# Patient Record
Sex: Female | Born: 1980 | Race: Black or African American | Marital: Single | Smoking: Current some day smoker
Health system: Southern US, Community
[De-identification: ages and names within clinical notes are randomized; demographics above are authoritative.]

## PROBLEM LIST (undated history)

## (undated) DIAGNOSIS — K219 Gastro-esophageal reflux disease without esophagitis: Secondary | ICD-10-CM

## (undated) DIAGNOSIS — J40 Bronchitis, not specified as acute or chronic: Secondary | ICD-10-CM

## (undated) DIAGNOSIS — R011 Cardiac murmur, unspecified: Secondary | ICD-10-CM

## (undated) DIAGNOSIS — E049 Nontoxic goiter, unspecified: Secondary | ICD-10-CM

## (undated) DIAGNOSIS — J349 Unspecified disorder of nose and nasal sinuses: Secondary | ICD-10-CM

## (undated) NOTE — ED Provider Notes (Signed)
 Formatting of this note is different from the original. EMERGENCY DEPARTMENT ENCOUNTER    CHIEF COMPLAINT   Chief Complaint  Patient presents with  ? COVID Positive    Patient presents to ED c/o body aches and fever. Pt has COVID.   HPI   Ruth Hubbard is a 40 y.o. female who presents with report of body aches and fever.  Patient reportedly tested positive for COVID at home yesterday.  She states fever resolved takes medicine but then comes back.  She has not had any sore throat, but endorses significant nasal congestion.  Reports nausea without vomiting, no diarrhea or constipation.  Denies asthma or COPD history.  Denies any other significant medical conditions.  No other concerns  PAST MEDICAL HISTORY   No past medical history on file.  SURGICAL HISTORY   No past surgical history on file.  CURRENT MEDICATIONS   Current Facility-Administered Medications  Medication Dose Route Frequency Provider Last Rate Last Admin  ? ketorolac  (TORADOL ) 30 mg/mL (1 mL) injection 15 mg  15 mg Intramuscular Once Jacquelynne Leretha Slack, PA-C       Current Outpatient Medications  Medication Sig Dispense Refill  ? nirmatrelvir-ritonavir (PAXLOVID) medication dosepack Take 400 mg by mouth 2 (two) times daily for 5 days. 30 tablet 0  ? ondansetron  (ZOFRAN -ODT) 4 MG disintegrating tablet Take 1 tablet (4 mg total) by mouth every 8 (eight) hours as needed for Nausea for up to 7 days. 20 tablet 0   ALLERGIES   No Known Allergies  FAMILY HISTORY   No family history on file.  SOCIAL HISTORY   Social History   Socioeconomic History  ? Marital status: Single    Spouse name: Not on file  ? Number of children: Not on file  ? Years of education: Not on file  ? Highest education level: Not on file  Occupational History  ? Not on file  Tobacco Use  ? Smoking status: Not on file  ? Smokeless tobacco: Not on file  Substance and Sexual Activity  ? Alcohol use: Not on file  ? Drug use: Not on file  ? Sexual  activity: Not on file  Other Topics Concern  ? Not on file  Social History Narrative  ? Not on file   Social Determinants of Health   Financial Resource Strain: Not on file  Food Insecurity: Not on file  Transportation Needs: Not on file  Social Connections: Not on file  Housing Stability: Not on file   REVIEW OF SYSTEMS   All systems reviewed and negative except for those mentioned above See HPI for further details.  PHYSICAL EXAM   VITAL SIGNS: BP 119/73   Pulse 100   Temp 99.9 F (37.7 C) (Oral) Comment: tylenol  at 1100  Resp 20   LMP 03/05/2022 (Approximate)   SpO2 95%  Constitutional:  Well developed, well nourished, no acute distress, non-toxic appearance.   Eyes:  PERRLA, EOMI,  conjunctiva and sclera clear, no discharge HENT: Atraumatic, Normocephalic, oral pharynx pink and moist, no tonsillar exudates or hypertrophy, no peritonsillar abscess or cellulitis. nares clear bilaterally.  TMs pearly gray with no effusion or erythema  Neck:Normal inspection, normal range of motion, supple, no meningeal signs, no lymphadenopathy Respiratory:  Normal breath sounds, no respiratory distress, no wheezing, no chest tenderness.  Cardiovascular:  Normal heart rate, normal rhythm, no murmurs, no rubs, no gallops.  Extremities:  Intact distal pulses, no edema Back: No midline or CVA tenderness.  Skin:  Warm, dry,  no erythema, no rash.  Neurologic:  Awake and alert, gross normal cognition No focal deficits noted,  CN II-XII  Intact as tested  RADIOLOGY   Reviewed by me. See official radiology interpretation.  LAB   ED COURSE & MEDICAL DECISION MAKING   Pertinent labs & imaging studies reviewed. (See chart for details)  Nontoxic-appearing 56 year old female no significant medical conditions coming in with positive home COVID test yesterday.  Body aches and URI symptoms today.  Differential includes but not limited to viral illness including COVID, flu, RSV.   Labs:  COVID, flu,  RSV   Imaging:  Not indicated  Patient positive home COVID test yesterday.  Discussed supportive care for symptoms.  She is requesting COVID testing here.  Discussed this would not change the course of treatment as this is a virus.  She then states she wants Paxlovid.  Discussed with patient risks and benefits of this medication.  She is fairly low risk for progressive disease, oxygen saturation 95% here with heart rate of 100, but patient has a fever and has not had medication and 5 hours.  Discussed that without asthma, COPD or other severe chronic underlying conditions the risks of this medication may outweigh the benefits.  Patient states she does not care her cousin had it and recommended she get it to.  She understands that if she gets side effects from the medication she should stop taking it. She was given a shot of Toradol  here for body aches.  Zofran  as needed for nausea.  Vital signs are stable.  Appropriate for discharge customary return precautions provided  CLINICAL IMPRESSION   1. COVID-19 virus infection      Portions of this note may be dictated using Dragon Naturally Speaking voice recognition software.  Variances in spelling and vocabulary are possible and unintentional.  Not all errors are caught/corrected.  Please notify the dino if any discrepancies are noted or if the meaning of any statement is not clear.  Jacquelynne Leretha Slack, PA-C 03/14/22 1655  Cosigned by Prentice All, MD at 03/14/2022  6:20 PM EST Electronically signed by Jacquelynne Leretha Slack, PA-C at 03/14/2022  4:55 PM EST Electronically signed by Prentice All, MD at 03/14/2022  6:20 PM EST

---
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
## 2001-07-28 ENCOUNTER — Other Ambulatory Visit: Admission: RE | Admit: 2001-07-28 | Discharge: 2001-07-28 | Payer: Self-pay | Admitting: Obstetrics & Gynecology

## 2003-01-25 ENCOUNTER — Encounter: Admission: RE | Admit: 2003-01-25 | Discharge: 2003-01-25 | Payer: Self-pay | Admitting: Family Medicine

## 2010-06-30 ENCOUNTER — Inpatient Hospital Stay (INDEPENDENT_AMBULATORY_CARE_PROVIDER_SITE_OTHER)
Admission: RE | Admit: 2010-06-30 | Discharge: 2010-06-30 | Disposition: A | Discharge status: Home / Self Care | Payer: Self-pay | Source: Ambulatory Visit | Attending: Family Medicine | Admitting: Family Medicine

## 2010-06-30 ENCOUNTER — Encounter: Payer: Self-pay | Admitting: Family Medicine

## 2010-06-30 DIAGNOSIS — L299 Pruritus, unspecified: Secondary | ICD-10-CM

## 2010-06-30 DIAGNOSIS — L02818 Cutaneous abscess of other sites: Secondary | ICD-10-CM

## 2010-07-02 ENCOUNTER — Encounter: Payer: Self-pay | Admitting: Emergency Medicine

## 2010-07-02 ENCOUNTER — Inpatient Hospital Stay (INDEPENDENT_AMBULATORY_CARE_PROVIDER_SITE_OTHER)
Admission: RE | Admit: 2010-07-02 | Discharge: 2010-07-02 | Disposition: A | Discharge status: Home / Self Care | Payer: Self-pay | Source: Ambulatory Visit | Attending: Emergency Medicine | Admitting: Emergency Medicine

## 2010-07-02 DIAGNOSIS — L299 Pruritus, unspecified: Secondary | ICD-10-CM

## 2010-07-02 DIAGNOSIS — L02818 Cutaneous abscess of other sites: Secondary | ICD-10-CM

## 2011-01-18 NOTE — Letter (Signed)
Summary: Out of Work  [facility]  [address]   [place], [place] [address]   Phone: [phone]  Fax: [phone]    Jun 30, 2010   Employee:  [employer] M [employer]    To Whom It May Concern:   Freyja was evaluated in our clinic this evening.   If you need additional information, please feel free to contact our office.         Sincerely,    Donna Christen MD

## 2011-01-18 NOTE — Progress Notes (Signed)
Summary: RASH GETTING WORSE   Vital Signs:  Patient Profile:   30 Years Old Female CC:      rash to face, neck and ears-progressively worse Height:     64.5 inches Weight:      140 pounds O2 Sat:      98 % O2 treatment:    Room Air Temp:     98.8 degrees F oral Pulse rate:   83 / minute Resp:     16 per minute BP sitting:   116 / 74  (left arm) Cuff size:   regular  Vitals Entered By: Lajean Saver RN (Jul 02, 2010 1:55 PM)                  Updated Prior Medication List: CEPHALEXIN 500 MG TABS (CEPHALEXIN) One by mouth three times daily (every 8 hours) VISTARIL 50 MG CAPS (HYDROXYZINE PAMOATE) One by mouth q6 to 8hr as needed itching GRIFULVIN V 500 MG TABS (GRISEOFULVIN MICROSIZE) 1 by mouth once daily PERMETHRIN 1 % LOTN (PERMETHRIN) Wash and dry hair.  Apply lotion to hair.  Leave on for 10 minutes then rinse.  May repeat one week  Current Allergies: No known allergies History of Present Illness History from: patient Chief Complaint: rash to face, neck and ears-progressively worse History of Present Illness: She was seen earlier this week.  Please see previous note.  The doctor gave her Keflex, Rocephin, Vistaril, Griseofulvin. She feels that she is getting worse in terms of her rash with increased drainage from her ears and back of neck.  No new soaps, shampoos, detergents, clothes.  She is highly sensitive to poison ivy but hasn't been around any lately.  No F/C.  She has swelling on the back of her neck and a mild HA due to that.  She is scheduled to see the dermatologist next week but can't wait until then.  She relates using things like H2O2 and alcohol on her skin to dry things out.  REVIEW OF SYSTEMS Constitutional Symptoms      Denies fever, chills, night sweats, weight loss, weight gain, and fatigue.  Eyes       Denies change in vision, eye pain, eye discharge, glasses, contact lenses, and eye surgery. Ear/Nose/Throat/Mouth       Denies hearing loss/aids,  change in hearing, ear pain, ear discharge, dizziness, frequent runny nose, frequent nose bleeds, sinus problems, sore throat, hoarseness, and tooth pain or bleeding.  Respiratory       Denies dry cough, productive cough, wheezing, shortness of breath, asthma, bronchitis, and emphysema/COPD.  Cardiovascular       Denies murmurs, chest pain, and tires easily with exhertion.    Gastrointestinal       Denies stomach pain, nausea/vomiting, diarrhea, constipation, blood in bowel movements, and indigestion. Genitourniary       Denies painful urination, kidney stones, and loss of urinary control. Neurological       Denies paralysis, seizures, and fainting/blackouts. Musculoskeletal       Denies muscle pain, joint pain, joint stiffness, decreased range of motion, redness, swelling, muscle weakness, and gout.  Skin       Denies bruising, unusual mles/lumps or sores, and hair/skin or nail changes.  Psych       Denies mood changes, temper/anger issues, anxiety/stress, speech problems, depression, and sleep problems. Other Comments: Patient has returned from visit 2 days ago. Her rash progressively become wose, speading to her neck, ears, scalp and down the side of her face.  Her neck and ears are swollen and draining clear fluid.   Past History:  Past Medical History: Reviewed history from 06/30/2010 and no changes required. thyroid goiter  Past Surgical History: Reviewed history from 06/30/2010 and no changes required. Denies surgical history  Family History: Reviewed history from 06/30/2010 and no changes required. Family History Thyroid disease  Social History: Current Smoker 2-3 cigs per day Alcohol use-yes 1-2 per mth Drug use-no Occupation: Runner, broadcasting/film/video Physical Exam General appearance: well developed, well nourished, no acute distress MSE: oriented to time, place, and person Anterior scalp is scaly, somewhat boggy and erythematous with decreased concentration of hair.  There is  evidence of dried exudate in matted hair around ears.  No swelling.  There is post-auricular andenopathy bilaterally.  There is bilateral pinna swelling and mild clear drainage.  There is moderate posterior neck LAD and swelling.  Plan New Medications/Changes: FLUOCINONIDE 0.05 % SOLN (FLUOCINONIDE) apply two times a day to hair and affected areas  #QS x1 week x 0, 07/02/2010, Hoyt Koch MD PREDNISONE (PAK) 10 MG TABS (PREDNISONE) use as directed, 6 day pack  #1 x 0, 07/02/2010, Hoyt Koch MD  New Orders: Est. Patient Level II [phone] Planning Comments:   I'm not quite sure what this rash is, but is likely a combination of fungal infection and irritation from her scratching it.  I don't necessarily think this is bacterial but would still like her to continue the Keflex.  I think if she stops scratching there will be less irritation and it will heal. I wrote her for Pred taper pack that I'd like her to hold.  I am going to start her on Fluocinonide hair solution just to get the irritation under control until she is able to see dermatology next week.  If the swelling on her ears or neck continues to worsens or she develops symptoms such as fever, I would advise that she go to the ER.  We called multiple dermatologists, but no one is able to see her sooner.   The patient and/or caregiver has been counseled thoroughly with regard to medications prescribed including dosage, schedule, interactions, rationale for use, and possible side effects and they verbalize understanding.  Diagnoses and expected course of recovery discussed and will return if not improved as expected or if the condition worsens. Patient and/or caregiver verbalized understanding.  Prescriptions: FLUOCINONIDE 0.05 % SOLN (FLUOCINONIDE) apply two times a day to hair and affected areas  #QS x1 week x 0   Entered and Authorized by:   Hoyt Koch MD   Signed by:   Hoyt Koch MD on 07/02/2010   Method used:    Print then Give to Patient   RxID:   [id number] PREDNISONE (PAK) 10 MG TABS (PREDNISONE) use as directed, 6 day pack  #1 x 0   Entered and Authorized by:   Hoyt Koch MD   Signed by:   Hoyt Koch MD on 07/02/2010   Method used:   Print then Give to Patient   RxID:   [id number]   Orders Added: 1)  Est. Patient Level II [84132]

## 2011-01-18 NOTE — Progress Notes (Signed)
Summary: ITCHING,RASH,LYMPH NODES SWOLLEN/TJ rm 4   Vital Signs:  Patient Profile:   30 Years Old Female CC:      rash x 1wk Height:     64.5 inches Weight:      142.25 pounds O2 Sat:      99 % O2 treatment:    Room Air Temp:     98.4 degrees F oral Pulse rate:   77 / minute Resp:     16 per minute BP sitting:   122 / 71  (left arm) Cuff size:   regular  Vitals Entered By: Clemens Catholic LPN (Jun 30, 2010 5:14 PM)                  Updated Prior Medication List: No Medications Current Allergies: No known allergies History of Present Illness Chief Complaint: rash x 1wk History of Present Illness:  Subjective:  Patient reports that she noticed swollen lymph nodes behind her ears about 2 weeks that have decreased in size but persisted.  About a week ago she began developing intense itching in her scalp primarily in the frontal area and around ears.  She has noticed slight drainage from her scalp over the past several days.  The itching is worse at night.  Over the past two days she has not felt well, and believes that she may have had some chills.  REVIEW OF SYSTEMS Constitutional Symptoms      Denies fever, chills, night sweats, weight loss, weight gain, and fatigue.  Eyes       Denies change in vision, eye pain, eye discharge, glasses, contact lenses, and eye surgery. Ear/Nose/Throat/Mouth       Denies hearing loss/aids, change in hearing, ear pain, ear discharge, dizziness, frequent runny nose, frequent nose bleeds, sinus problems, sore throat, hoarseness, and tooth pain or bleeding.  Respiratory       Denies dry cough, productive cough, wheezing, shortness of breath, asthma, bronchitis, and emphysema/COPD.  Cardiovascular       Denies murmurs, chest pain, and tires easily with exhertion.    Gastrointestinal       Denies stomach pain, nausea/vomiting, diarrhea, constipation, blood in bowel movements, and indigestion. Genitourniary       Denies painful urination,  kidney stones, and loss of urinary control. Neurological       Denies paralysis, seizures, and fainting/blackouts. Musculoskeletal       Denies muscle pain, joint pain, joint stiffness, decreased range of motion, redness, swelling, muscle weakness, and gout.  Skin       Denies bruising, unusual mles/lumps or sores, and hair/skin or nail changes.  Psych       Denies mood changes, temper/anger issues, anxiety/stress, speech problems, depression, and sleep problems. Blood-Lymph       Complains of unexplained lumps. Other Comments: pt c/o rash x 1wk around her hair line, ears , and face. she also states that the lymph nodes behind her ears are swollen. no fever. she had been taking bee pollen pills, she stopped those on Sunday. she put peroxide on her the rash on her head and she has been taking Benadryl at night since Sunday.   Past History:  Past Medical History: thyroid goiter  Past Surgical History: Denies surgical history  Family History: Family History Thyroid disease  Social History: Current Smoker 2-3 cigs per day Alcohol use-yes 1-2 per mth Drug use-no Smoking Status:  current Drug Use:  no   Objective:  No acute distress  Eyes:  Pupils are  equal, round, and reactive to light and accomodation.  Extraocular movement is intact.  Conjunctivae are not inflamed.  Ears:  Mild swelling, erythema, and tenderness of the superior helix bilaterally Nose:   No sinus tenderness  Mouth/pharynx:  No lesions Neck:  Supple.  No adenopathy is present.  Goiter is present  Lungs:  Clear to auscultation.  Breath sounds are equal.  Heart:  Regular rate and rhythm  Abdomen:  Nontender without masses or hepatosplenomegaly.  Bowel sounds are present.  No CVA or flank tenderness.  Scalp:  Anterior scalp is scaly, somewhat boggy and erythematous with decreased concentration of hair.  There is evidence of dried exudate in matted hair around ears.  No swelling.  Several hairs may possibly have  nits present, although these could also be debris from scalp.  Anterior scalp fluoresces under Wood's lamp.  There is post-auricular andenopathy bilaterally. Assessment New Problems: PRURITUS (ICD-698.9) CELLULITIS, SCALP (ICD-682.8)  SUSPECT UNDERLYING TINEA CAPITIS AND PEDICULOSIS CAPITIS  Plan New Medications/Changes: PERMETHRIN 1 % LOTN (PERMETHRIN) Wash and dry hair.  Apply lotion to hair.  Leave on for 10 minutes then rinse.  May repeat one week  #1 bottle x 1, 06/30/2010, Donna Christen MD GRIFULVIN V 500 MG TABS (GRISEOFULVIN MICROSIZE) 1 by mouth once daily  #15 x 0, 06/30/2010, Donna Christen MD VISTARIL 50 MG CAPS (HYDROXYZINE PAMOATE) One by mouth q6 to 8hr as needed itching  #30 x 1, 06/30/2010, Donna Christen MD CEPHALEXIN 500 MG TABS (CEPHALEXIN) One by mouth three times daily (every 8 hours)  #30 x 0, 06/30/2010, Donna Christen MD  New Orders: T-Culture, Wound [[id number]] CBC w/Diff [[id number]] T- * Misc. Laboratory test [99999] Rocephin  250mg  [J0696] Admin of Therapeutic Inj  intramuscular or subcutaneous [96372] New Patient Level III [99203] Planning Comments:   Rocephin 500mg  IM.  Begin Keflex 500mg  q8hr.  Vistaril for itching.  Begin Grifulvin V 500mg  once daily with a fatty meal (treat for 4 to 6 weeks).  Wound culture and fungal cultures pending.  When improving in 2 to 4 days, apply permethrin lotion and rinse off (may repeat in one week) If not improving, recommend dermatology evaluation   The patient and/or caregiver has been counseled thoroughly with regard to medications prescribed including dosage, schedule, interactions, rationale for use, and possible side effects and they verbalize understanding.  Diagnoses and expected course of recovery discussed and will return if not improved as expected or if the condition worsens. Patient and/or caregiver verbalized understanding.  Prescriptions: PERMETHRIN 1 % LOTN (PERMETHRIN) Wash and dry hair.  Apply lotion  to hair.  Leave on for 10 minutes then rinse.  May repeat one week  #1 bottle x 1   Entered and Authorized by:   Donna Christen MD   Signed by:   Donna Christen MD on 06/30/2010   Method used:   Print then Give to Patient   RxID:   [id number] GRIFULVIN V 500 MG TABS (GRISEOFULVIN MICROSIZE) 1 by mouth once daily  #15 x 0   Entered and Authorized by:   Donna Christen MD   Signed by:   Donna Christen MD on 06/30/2010   Method used:   Print then Give to Patient   RxID:   [id number] VISTARIL 50 MG CAPS (HYDROXYZINE PAMOATE) One by mouth q6 to 8hr as needed itching  #30 x 1   Entered and Authorized by:   Donna Christen MD   Signed by:   Donna Christen MD on 06/30/2010   Method  used:   Print then Give to Patient   RxID:   [id number] CEPHALEXIN 500 MG TABS (CEPHALEXIN) One by mouth three times daily (every 8 hours)  #30 x 0   Entered and Authorized by:   Donna Christen MD   Signed by:   Donna Christen MD on 06/30/2010   Method used:   Print then Give to Patient   RxID:   [id number]   Medication Administration  Injection # 1:    Medication: Rocephin  250mg     Diagnosis: CELLULITIS, SCALP (ICD-682.8)    Route: IM    Site: RUOQ gluteus    Exp Date: 04/16/2011    Lot #: [id number]    Mfr: sandoz    Comments: 500mg  given    Patient tolerated injection without complications    Given by: Clemens Catholic LPN (Jun 30, 2010 6:29 PM)  Orders Added: 1)  T-Culture, Wound [[id number]] 2)  CBC w/Diff [[id number]] 3)  T- * Misc. Laboratory test [99999] 4)  Rocephin  250mg  [J0696] 5)  Admin of Therapeutic Inj  intramuscular or subcutaneous [96372] 6)  New Patient Level III [01027]

## 2011-07-30 ENCOUNTER — Emergency Department (HOSPITAL_BASED_OUTPATIENT_CLINIC_OR_DEPARTMENT_OTHER)
Admission: EM | Admit: 2011-07-30 | Discharge: 2011-07-30 | Disposition: A | Discharge status: Home / Self Care | Payer: Self-pay | Attending: Emergency Medicine | Admitting: Emergency Medicine

## 2011-07-30 ENCOUNTER — Emergency Department (HOSPITAL_BASED_OUTPATIENT_CLINIC_OR_DEPARTMENT_OTHER): Payer: Self-pay

## 2011-07-30 ENCOUNTER — Encounter (HOSPITAL_BASED_OUTPATIENT_CLINIC_OR_DEPARTMENT_OTHER): Payer: Self-pay | Admitting: *Deleted

## 2011-07-30 DIAGNOSIS — G44009 Cluster headache syndrome, unspecified, not intractable: Secondary | ICD-10-CM | POA: Insufficient documentation

## 2011-07-30 DIAGNOSIS — R51 Headache: Secondary | ICD-10-CM | POA: Insufficient documentation

## 2011-07-30 HISTORY — DX: Cardiac murmur, unspecified: R01.1

## 2011-07-30 HISTORY — DX: Gastro-esophageal reflux disease without esophagitis: K21.9

## 2011-07-30 HISTORY — DX: Bronchitis, not specified as acute or chronic: J40

## 2011-07-30 HISTORY — DX: Unspecified disorder of nose and nasal sinuses: J34.9

## 2011-07-30 MED ORDER — METOCLOPRAMIDE HCL 10 MG PO TABS: 10.0000 mg | ORAL_TABLET | Freq: Four times a day (QID) | ORAL | Status: DC | PRN

## 2011-07-30 MED ORDER — PREDNISONE 50 MG PO TABS
60.0000 mg | ORAL_TABLET | Freq: Once | ORAL | Status: AC
  Administered 2011-07-30: 60 mg via ORAL
  Filled 2011-07-30: qty 1

## 2011-07-30 MED ORDER — PREDNISONE 20 MG PO TABS: 60.0000 mg | ORAL_TABLET | Freq: Every day | ORAL | Status: DC

## 2011-07-30 MED ORDER — OXYCODONE-ACETAMINOPHEN 5-325 MG PO TABS: 1.0000 | ORAL_TABLET | ORAL | Status: AC | PRN

## 2011-07-30 NOTE — Discharge Instructions (Signed)
Call the neurologist for followup and evaluation to see if you need any further testing.  Cluster Headache Cluster headaches are recognized by their pattern of deep, intense head pain. They normally occur on one side of the head. Typically, cluster headaches:  Are severe in nature.   Occur repeatedly over weeks to months at a time and are then followed by periods of no headaches.   Can last 15 minutes to 3 hours.   Occur at the same time each day, often at night.   Occur several times a day.  CAUSES The exact cause of a cluster headache is not known. Some things can trigger a cluster headache, such as:  Smoking.   Alcohol use.   Lack of sleep.   High altitude travel, such as airline travel.   Change of seasons.   Certain foods, such as foods or drinks that contain nitrates, glutamate, aspartame, or tyramine.  SYMPTOMS   Severe pain that begins in or around the eye or temple.   One-sided head pain.   Feeling sick to your stomach (nauseous).   Sensitivity to light.   Runny nose.   Eye redness, tearing, and nasal stuffiness on the side of your head where you are experiencing pain.   Sweaty, pale skin of the face.   Droopy eyelid.  DIAGNOSIS  Cluster headaches are diagnosed based on symptoms and physical exam. Your caregiver may order a computerized X-ray scan (CT or CAT scan) or a computerized magnetic scan (MRI) of your head or other lab tests to see if your headaches are caused from other medical conditions.  TREATMENT   Medications for pain relief and to prevent recurrent attacks.   Oxygen for pain relief.   Biofeedback programs may help reduce headache pain.  Some people may need a combination of medicines for cluster headaches. It may be helpful to keep a headache diary. This may help you find a trend for what is triggering your headaches. Your caregiver can develop a treatment plan that can be helpful to you.  HOME CARE INSTRUCTIONS  During cluster  periods:  Follow a regular sleep schedule.Do not vary the amount and time that you sleep from day to day. It is important to stay on the same schedule during a cluster period to help prevent headaches.   Avoid alcohol.   Stop smoking.  SEEK IMMEDIATE MEDICAL CARE IF:   You have any changes from your previous cluster headaches either in intensity or frequency.   You are not getting relief from medicines you are taking.   You faint.   You develop weakness or numbness, especially on one side of your body or face.   You develop double vision.   You develop nausea or vomiting.   You cannot keep your balance or have difficulty talking or walking.   You develop neck pain or neck stiffness.   You have a fever.  Document Released: 02/01/2005 Document Revised: 01/21/2011 Document Reviewed: 05/02/2010 [facility] Patient Information 2012 [place], [place].  Prednisone tablets What is this medicine? PREDNISONE (PRED ni sone) is a corticosteroid. It is commonly used to treat inflammation of the skin, joints, lungs, and other organs. Common conditions treated include asthma, allergies, and arthritis. It is also used for other conditions, such as blood disorders and diseases of the adrenal glands. This medicine may be used for other purposes; ask your health care provider or pharmacist if you have questions. What should I tell my health care provider before I take this medicine? They  need to know if you have any of these conditions: -Cushing's syndrome -diabetes -glaucoma -heart disease -high blood pressure -infection (especially a virus infection such as chickenpox, cold sores, or herpes) -kidney disease -liver disease -mental illness -myasthenia gravis -osteoporosis -seizures -stomach or intestine problems -thyroid disease -an unusual or allergic reaction to lactose, prednisone, other medicines, foods, dyes, or preservatives -pregnant or trying to get pregnant -breast-feeding How  should I use this medicine? Take this medicine by mouth with a glass of water. Follow the directions on the prescription label. Take this medicine with food. If you are taking this medicine once a day, take it in the morning. Do not take more medicine than you are told to take. Do not suddenly stop taking your medicine because you may develop a severe reaction. Your doctor will tell you how much medicine to take. If your doctor wants you to stop the medicine, the dose may be slowly lowered over time to avoid any side effects. Talk to your pediatrician regarding the use of this medicine in children. Special care may be needed. Overdosage: If you think you have taken too much of this medicine contact a poison control center or emergency room at once. NOTE: This medicine is only for you. Do not share this medicine with others. What if I miss a dose? If you miss a dose, take it as soon as you can. If it is almost time for your next dose, talk to your doctor or health care professional. You may need to miss a dose or take an extra dose. Do not take double or extra doses without advice. What may interact with this medicine? Do not take this medicine with any of the following medications: -metyrapone -mifepristone This medicine may also interact with the following medications: -aminoglutethimide -amphotericin B -aspirin and aspirin-like medicines -barbiturates -certain medicines for diabetes, like glipizide or glyburide -cholestyramine -cholinesterase inhibitors -cyclosporine -digoxin -diuretics -ephedrine -female hormones, like estrogens and birth control pills -isoniazid -ketoconazole -NSAIDS, medicines for pain and inflammation, like ibuprofen or naproxen -phenytoin -rifampin -toxoids -vaccines -warfarin This list may not describe all possible interactions. Give your health care provider a list of all the medicines, herbs, non-prescription drugs, or dietary supplements you use. Also tell  them if you smoke, drink alcohol, or use illegal drugs. Some items may interact with your medicine. What should I watch for while using this medicine? Visit your doctor or health care professional for regular checks on your progress. If you are taking this medicine over a prolonged period, carry an identification card with your name and address, the type and dose of your medicine, and your doctor's name and address. This medicine may increase your risk of getting an infection. Tell your doctor or health care professional if you are around anyone with measles or chickenpox, or if you develop sores or blisters that do not heal properly. If you are going to have surgery, tell your doctor or health care professional that you have taken this medicine within the last twelve months. Ask your doctor or health care professional about your diet. You may need to lower the amount of salt you eat. This medicine may affect blood sugar levels. If you have diabetes, check with your doctor or health care professional before you change your diet or the dose of your diabetic medicine. What side effects may I notice from receiving this medicine? Side effects that you should report to your doctor or health care professional as soon as possible: -allergic reactions like skin  rash, itching or hives, swelling of the face, lips, or tongue -changes in emotions or moods -changes in vision -depressed mood -eye pain -fever or chills, cough, sore throat, pain or difficulty passing urine -increased thirst -swelling of ankles, feet Side effects that usually do not require medical attention (report to your doctor or health care professional if they continue or are bothersome): -confusion, excitement, restlessness -headache -nausea, vomiting -skin problems, acne, thin and shiny skin -trouble sleeping -weight gain This list may not describe all possible side effects. Call your doctor for medical advice about side effects. You  may report side effects to FDA at 1-800-FDA-1088. Where should I keep my medicine? Keep out of the reach of children. Store at room temperature between 15 and 30 degrees C (59 and 86 degrees F). Protect from light. Keep container tightly closed. Throw away any unused medicine after the expiration date. NOTE: This sheet is a summary. It may not cover all possible information. If you have questions about this medicine, talk to your doctor, pharmacist, or health care provider.  2012, [employer]. (09/17/2010 10:57:14 AM)  Metoclopramide tablets What is this medicine? METOCLOPRAMIDE (met oh kloe PRA mide) is used to treat the symptoms of gastroesophageal reflux disease (GERD) like heartburn. It is also used to treat people with slow emptying of the stomach and intestinal tract. This medicine may be used for other purposes; ask your health care provider or pharmacist if you have questions. What should I tell my health care provider before I take this medicine? They need to know if you have any of these conditions: -breast cancer -depression -diabetes -heart failure -high blood pressure -kidney disease -liver disease -Parkinson's disease or a movement disorder -pheochromocytoma -seizures -stomach obstruction, bleeding, or perforation -an unusual or allergic reaction to metoclopramide, procainamide, sulfites, other medicines, foods, dyes, or preservatives -pregnant or trying to get pregnant -breast-feeding How should I use this medicine? Take this medicine by mouth with a glass of water. Follow the directions on the prescription label. Take this medicine on an empty stomach, about 30 minutes before eating. Take your doses at regular intervals. Do not take your medicine more often than directed. Do not stop taking except on the advice of your doctor or health care professional. A special MedGuide will be given to you by the pharmacist with each prescription and refill. Be sure to read  this information carefully each time. Talk to your pediatrician regarding the use of this medicine in children. Special care may be needed. Overdosage: If you think you have taken too much of this medicine contact a poison control center or emergency room at once. NOTE: This medicine is only for you. Do not share this medicine with others. What if I miss a dose? If you miss a dose, take it as soon as you can. If it is almost time for your next dose, take only that dose. Do not take double or extra doses. What may interact with this medicine? -acetaminophen -cyclosporine -digoxin -medicines for blood pressure -medicines for diabetes, including insulin -medicines for hay fever and other allergies -medicines for depression, especially an Monoamine Oxidase Inhibitor (MAOI) -medicines for Parkinson's disease, like levodopa -medicines for sleep or for pain -tetracycline This list may not describe all possible interactions. Give your health care provider a list of all the medicines, herbs, non-prescription drugs, or dietary supplements you use. Also tell them if you smoke, drink alcohol, or use illegal drugs. Some items may interact with your medicine. What should I watch  for while using this medicine? It may take a few weeks for your stomach condition to start to get better. However, do not take this medicine for longer than 12 weeks. The longer you take this medicine, and the more you take it, the greater your chances are of developing serious side effects. If you are an elderly patient, a female patient, or you have diabetes, you may be at an increased risk for side effects from this medicine. Contact your doctor immediately if you start having movements you cannot control such as lip smacking, rapid movements of the tongue, involuntary or uncontrollable movements of the eyes, head, arms and legs, or muscle twitches and spasms. Patients and their families should watch out for worsening depression or  thoughts of suicide. Also watch out for any sudden or severe changes in feelings such as feeling anxious, agitated, panicky, irritable, hostile, aggressive, impulsive, severely restless, overly excited and hyperactive, or not being able to sleep. If this happens, especially at the beginning of treatment or after a change in dose, call your doctor. Do not treat yourself for high fever. Ask your doctor or health care professional for advice. You may get drowsy or dizzy. Do not drive, use machinery, or do anything that needs mental alertness until you know how this drug affects you. Do not stand or sit up quickly, especially if you are an older patient. This reduces the risk of dizzy or fainting spells. Alcohol can make you more drowsy and dizzy. Avoid alcoholic drinks. What side effects may I notice from receiving this medicine? Side effects that you should report to your doctor or health care professional as soon as possible: -allergic reactions like skin rash, itching or hives, swelling of the face, lips, or tongue -abnormal production of milk in females -breast enlargement in both males and females -change in the way you walk -difficulty moving, speaking or swallowing -drooling, lip smacking, or rapid movements of the tongue -excessive sweating -fever -involuntary or uncontrollable movements of the eyes, head, arms and legs -irregular heartbeat or palpitations -muscle twitches and spasms -unusually weak or tired Side effects that usually do not require medical attention (report to your doctor or health care professional if they continue or are bothersome): -change in sex drive or performance -depressed mood -diarrhea -difficulty sleeping -headache -menstrual changes -restless or nervous This list may not describe all possible side effects. Call your doctor for medical advice about side effects. You may report side effects to FDA at 1-800-FDA-1088. Where should I keep my medicine? Keep out  of the reach of children. Store at room temperature between 20 and 25 degrees C (68 and 77 degrees F). Protect from light. Keep container tightly closed. Throw away any unused medicine after the expiration date. NOTE: This sheet is a summary. It may not cover all possible information. If you have questions about this medicine, talk to your doctor, pharmacist, or health care provider.  2012, [employer]. (09/27/2007 4:30:05 PM)  Acetaminophen; Oxycodone tablets What is this medicine? ACETAMINOPHEN; OXYCODONE (a set a MEE noe fen; ox i KOE done) is a pain reliever. It is used to treat mild to moderate pain. This medicine may be used for other purposes; ask your health care provider or pharmacist if you have questions. What should I tell my health care provider before I take this medicine? They need to know if you have any of these conditions: -brain tumor -Crohn's disease, inflammatory bowel disease, or ulcerative colitis -drink more than 3 alcohol containing drinks  per day -drug abuse or addiction -head injury -heart or circulation problems -kidney disease or problems going to the bathroom -liver disease -lung disease, asthma, or breathing problems -an unusual or allergic reaction to acetaminophen, oxycodone, other opioid analgesics, other medicines, foods, dyes, or preservatives -pregnant or trying to get pregnant -breast-feeding How should I use this medicine? Take this medicine by mouth with a full glass of water. Follow the directions on the prescription label. Take your medicine at regular intervals. Do not take your medicine more often than directed. Talk to your pediatrician regarding the use of this medicine in children. Special care may be needed. Patients over 6 years old may have a stronger reaction and need a smaller dose. Overdosage: If you think you have taken too much of this medicine contact a poison control center or emergency room at once. NOTE: This medicine  is only for you. Do not share this medicine with others. What if I miss a dose? If you miss a dose, take it as soon as you can. If it is almost time for your next dose, take only that dose. Do not take double or extra doses. What may interact with this medicine? -alcohol or medicines that contain alcohol -antihistamines -barbiturates like amobarbital, butalbital, butabarbital, methohexital, pentobarbital, phenobarbital, thiopental, and secobarbital -benztropine -drugs for bladder problems like solifenacin, trospium, oxybutynin, tolterodine, hyoscyamine, and methscopolamine -drugs for breathing problems like ipratropium and tiotropium -drugs for certain stomach or intestine problems like propantheline, homatropine methylbromide, glycopyrrolate, atropine, belladonna, and dicyclomine -general anesthetics like etomidate, ketamine, nitrous oxide, propofol, desflurane, enflurane, halothane, isoflurane, and sevoflurane -medicines for depression, anxiety, or psychotic disturbances -medicines for pain like codeine, morphine, pentazocine, buprenorphine, butorphanol, nalbuphine, tramadol, and propoxyphene -medicines for sleep -muscle relaxants -naltrexone -phenothiazines like perphenazine, thioridazine, chlorpromazine, mesoridazine, fluphenazine, prochlorperazine, promazine, and trifluoperazine -scopolamine -trihexyphenidyl This list may not describe all possible interactions. Give your health care provider a list of all the medicines, herbs, non-prescription drugs, or dietary supplements you use. Also tell them if you smoke, drink alcohol, or use illegal drugs. Some items may interact with your medicine. What should I watch for while using this medicine? Tell your doctor or health care professional if your pain does not go away, if it gets worse, or if you have new or a different type of pain. You may develop tolerance to the medicine. Tolerance means that you will need a higher dose of the medication  for pain relief. Tolerance is normal and is expected if you take this medicine for a long time. Do not suddenly stop taking your medicine because you may develop a severe reaction. Your body becomes used to the medicine. This does NOT mean you are addicted. Addiction is a behavior related to getting and using a drug for a nonmedical reason. If you have pain, you have a medical reason to take pain medicine. Your doctor will tell you how much medicine to take. If your doctor wants you to stop the medicine, the dose will be slowly lowered over time to avoid any side effects. You may get drowsy or dizzy. Do not drive, use machinery, or do anything that needs mental alertness until you know how this medicine affects you. Do not stand or sit up quickly, especially if you are an older patient. This reduces the risk of dizzy or fainting spells. Alcohol may interfere with the effect of this medicine. Avoid alcoholic drinks. The medicine will cause constipation. Try to have a bowel movement at least every 2 to 3  days. If you do not have a bowel movement for 3 days, call your doctor or health care professional. Do not take Tylenol (acetaminophen) or medicines that have acetaminophen with this medicine. Too much acetaminophen can be very dangerous. Many nonprescription medicines contain acetaminophen. Always read the labels carefully to avoid taking more acetaminophen. What side effects may I notice from receiving this medicine? Side effects that you should report to your doctor or health care professional as soon as possible: -allergic reactions like skin rash, itching or hives, swelling of the face, lips, or tongue -breathing difficulties, wheezing -confusion -light headedness or fainting spells -severe stomach pain -yellowing of the skin or the whites of the eyes Side effects that usually do not require medical attention (report to your doctor or health care professional if they continue or are  bothersome): -dizziness -drowsiness -nausea -vomiting This list may not describe all possible side effects. Call your doctor for medical advice about side effects. You may report side effects to FDA at 1-800-FDA-1088. Where should I keep my medicine? Keep out of the reach of children. This medicine can be abused. Keep your medicine in a safe place to protect it from theft. Do not share this medicine with anyone. Selling or giving away this medicine is dangerous and against the law. Store at room temperature between 20 and 25 degrees C (68 and 77 degrees F). Keep container tightly closed. Protect from light. Flush any unused medicines down the toilet. Do not use the medicine after the expiration date. NOTE: This sheet is a summary. It may not cover all possible information. If you have questions about this medicine, talk to your doctor, pharmacist, or health care provider.  2012, [employer]. (01/01/2008 10:01:21 AM)

## 2011-07-30 NOTE — ED Provider Notes (Signed)
History     CSN: [id number]  Arrival date & time 07/30/11  1244   First MD Initiated Contact with Patient 07/30/11 1308      Chief Complaint  Patient presents with  . Headache    (Consider location/radiation/quality/duration/timing/severity/associated sxs/prior treatment) Patient is a 31 y.o. female presenting with headaches. The history is provided by the patient.  Headache   She has been having a left-sided headache for the last 2 weeks. Pain is intermittent, lasts anywhere from a few seconds to 20 minutes. Pain has been getting worse and coming on more frequently. It starts as a sharp pain which starts around her left ear and radiates to her high and jaw. There is tearing associated with this but no new rhinorrhea. Denies visual disturbance. She denies photophobia and phonophobia. There's been no nausea or vomiting. She denies fever, chills, sweats. She has not had headaches like this before. She has tried a number of over-the-counter medications with no relief. Pain is currently 0/10, but has been as severe as 10 over 10. Pain will wake her up and will keep her from going to sleep. Past Medical History  Diagnosis Date  . Murmur   . Bronchitis   . Sinus trouble   . Acid reflux     History reviewed. No pertinent past surgical history.  No family history on file.  History  Substance Use Topics  . Smoking status: Current Some Day Smoker  . Smokeless tobacco: Not on file  . Alcohol Use: Yes    OB History    Grav Para Term Preterm Abortions TAB SAB Ect Mult Living                  Review of Systems  Neurological: Positive for headaches.  All other systems reviewed and are negative.    Allergies  Review of patient's allergies indicates no known allergies.  Home Medications  No current outpatient prescriptions on file.  BP 146/71  Pulse 77  Temp 98.6 F (37 C) (Oral)  Resp 22  Ht 5\' 5"  (1.651 m)  Wt 140 lb (63.504 kg)  BMI 23.30 kg/m2  SpO2 100%  LMP  07/04/2011  Physical Exam  Nursing note and vitals reviewed. 31 year old female resting comfortably and in no acute distress. Vital signs are significant for mild hypertension with blood pressure 146/71. Oxygen saturation is 100% which is normal. Head is normocephalic and atraumatic. PERRLA, EOMI. There is mild tenderness to palpation over the left temporalis muscle and over the left paracervical muscles. There is no tenderness palpation over the frontal or maxillary sinuses and no tenderness palpation over the TMJ. TMs are clear. Fundi show no hemorrhage, exudate, or papilledema. Examination the nasal cavity shows no discharge present and turbinates appear normal. Neck is supple with with no midline tenderness but mild tenderness to palpation over the insertion of the left paracervical muscles. Back is nontender. Lungs are clear without rales, wheezes, or rhonchi. Heart has regular rate and rhythm with a 2/6 systolic ejection murmur heard along the sternal border. Abdomen is soft, flat, nontender without masses or hepatosplenomegaly. Extremities have full range of motion, no cyanosis or edema. Skin is warm and dry without rash. Neurologic: Mental status is normal, cranial nerves are intact, there are no motor or sensory deficits. Deep tendon reflexes are 2+ and symmetric.   ED Course  Procedures (including critical care time)  Ct Head Wo Contrast  07/30/2011  *RADIOLOGY REPORT*  Clinical Data: Left-sided headaches extending into the jaw for  2 weeks.  CT HEAD WITHOUT CONTRAST  Technique:  Contiguous axial images were obtained from the base of the skull through the vertex without contrast.  Comparison: MRI brain 01/25/2003.  Findings: There is no evidence of acute intracranial hemorrhage, mass lesion, brain edema or extra-axial fluid collection.  The ventricles and subarachnoid spaces are appropriately sized for age. There is no CT evidence of acute cortical infarction.  The visualized paranasal sinuses are  clear. The calvarium is intact.  Apparent asymmetry of the parotid glands is likely positional.  IMPRESSION: Stable normal appearance of the brain.  Asymmetry of the parotid glands, likely positional - correlate clinically.  Original Report Authenticated By: Gerrianne Scale, M.D.     1. Cluster headache       MDM   headache which seems most consistent with a cluster headache. CT will be obtained. She is currently pain free, so no treatment modalities can be tried in less than headache comes back while she is in the ED.  Patient was informed of the CT results and proposed treatment plan. She then asked me if the CT scan and normal blood pressure meds but she was not about to have a stroke and did not have an aneurysm. She was informed that neither test guaranteed either and that the testing for an aneurysm that had not ruptured would be an MR today which is not routinely done through the emergency department. When asked why she was concerned about an aneurysm, she states that her headache felt like an aneurysm. When asked how she knew when an aneurysm felt like, she says that a friend of hers had an aneurysm of fluid and when she talked with her friend, it felt the same as her headache. I have explained to her that CT would identify a ruptured aneurysm and that the only aneurysms that cause headaches like what she is describing would be a ruptured aneurysm. I also explained, that the pattern of her headache with it being episodic is not consistent with a headache from an aneurysm. However, she is referred to [facility] who can do additional testing if they feel it is indicated. She is sent home with prescriptions for prednisone, metoclopramide, and Percocet.    Dione Booze, MD 07/30/11 [phone]

## 2011-07-30 NOTE — ED Notes (Signed)
Patient states she has had an intermittent headache for the last 2 weeks.  Describes the pain as starting in her left ear radiating into her left jaw, face and behind her eye.  Has used multiple OTC pain meds which relieve the pain.  States the pain varies up to 12 times per day.  Denies nausea or vomiting.  States pain has worsened over the last 2 days.

## 2017-09-20 ENCOUNTER — Other Ambulatory Visit (HOSPITAL_COMMUNITY): Payer: Self-pay | Admitting: Obstetrics and Gynecology

## 2017-09-20 DIAGNOSIS — E049 Nontoxic goiter, unspecified: Secondary | ICD-10-CM

## 2017-09-20 DIAGNOSIS — R011 Cardiac murmur, unspecified: Secondary | ICD-10-CM

## 2017-09-22 ENCOUNTER — Ambulatory Visit (HOSPITAL_COMMUNITY)
Admission: RE | Admit: 2017-09-22 | Discharge: 2017-09-22 | Disposition: A | Discharge status: Home / Self Care | Payer: BC Managed Care – PPO | Source: Ambulatory Visit | Attending: Obstetrics and Gynecology | Admitting: Obstetrics and Gynecology

## 2017-09-22 DIAGNOSIS — E042 Nontoxic multinodular goiter: Secondary | ICD-10-CM | POA: Insufficient documentation

## 2017-09-22 DIAGNOSIS — E041 Nontoxic single thyroid nodule: Secondary | ICD-10-CM | POA: Diagnosis present

## 2017-09-22 DIAGNOSIS — E049 Nontoxic goiter, unspecified: Secondary | ICD-10-CM

## 2017-09-27 ENCOUNTER — Other Ambulatory Visit: Payer: Self-pay | Admitting: Obstetrics and Gynecology

## 2017-09-27 DIAGNOSIS — E041 Nontoxic single thyroid nodule: Secondary | ICD-10-CM

## 2017-09-28 ENCOUNTER — Ambulatory Visit (HOSPITAL_COMMUNITY)
Admission: RE | Admit: 2017-09-28 | Discharge: 2017-09-28 | Disposition: A | Discharge status: Home / Self Care | Payer: BC Managed Care – PPO | Source: Ambulatory Visit | Attending: Obstetrics and Gynecology | Admitting: Obstetrics and Gynecology

## 2017-09-28 DIAGNOSIS — R011 Cardiac murmur, unspecified: Secondary | ICD-10-CM | POA: Diagnosis not present

## 2017-09-28 DIAGNOSIS — I351 Nonrheumatic aortic (valve) insufficiency: Secondary | ICD-10-CM | POA: Insufficient documentation

## 2017-09-28 DIAGNOSIS — I517 Cardiomegaly: Secondary | ICD-10-CM | POA: Insufficient documentation

## 2017-09-28 NOTE — Progress Notes (Signed)
  Echocardiogram 2D Echocardiogram has been performed.  Roosvelt MaserLane, Denia Mcvicar F 09/28/2017, 11:16 AM

## 2017-09-29 ENCOUNTER — Other Ambulatory Visit (HOSPITAL_COMMUNITY)
Admission: RE | Admit: 2017-09-29 | Discharge: 2017-09-29 | Disposition: A | Discharge status: Home / Self Care | Payer: BC Managed Care – PPO | Source: Ambulatory Visit | Attending: Physician Assistant | Admitting: Physician Assistant

## 2017-09-29 ENCOUNTER — Ambulatory Visit
Admission: RE | Admit: 2017-09-29 | Discharge: 2017-09-29 | Disposition: A | Discharge status: Home / Self Care | Payer: BC Managed Care – PPO | Source: Ambulatory Visit | Attending: Obstetrics and Gynecology | Admitting: Obstetrics and Gynecology

## 2017-09-29 DIAGNOSIS — E041 Nontoxic single thyroid nodule: Secondary | ICD-10-CM | POA: Diagnosis not present

## 2017-09-29 NOTE — Procedures (Addendum)
PROCEDURE SUMMARY:  Using direct ultrasound guidance, 3 passes were made using 25 g needles into the nodule within the right inferior lobe of the thyroid.   Using direct ultrasound guidance, 4 passes were made using 25 g needles into the nodule within the right mid lobe of the thyroid.   Ultrasound was used to confirm needle placements on all occasions.   Specimens were sent to Pathology for analysis.  See procedure note under Imaging tab in Epic for full procedure details.  Toniann FailWENDY S Shaylen Nephew PA-C 09/29/2017 3:47 PM

## 2018-02-16 ENCOUNTER — Encounter: Payer: Self-pay | Admitting: Cardiovascular Disease

## 2018-02-16 ENCOUNTER — Ambulatory Visit: Payer: BC Managed Care – PPO | Admitting: Cardiovascular Disease

## 2018-02-16 ENCOUNTER — Encounter

## 2018-02-16 DIAGNOSIS — R002 Palpitations: Secondary | ICD-10-CM

## 2018-02-16 DIAGNOSIS — R011 Cardiac murmur, unspecified: Secondary | ICD-10-CM | POA: Diagnosis not present

## 2018-02-16 NOTE — Patient Instructions (Signed)
Medication Instructions:  NO CHANGES If you need a refill on your cardiac medications before your next appointment, please call your pharmacy.   Lab work: NONE If you have labs (blood work) drawn today and your tests are completely normal, you will receive your results only by: Marland Kitchen MyChart Message (if you have MyChart) OR . A paper copy in the mail If you have any lab test that is abnormal or we need to change your treatment, we will call you to review the results.  Testing/Procedures: NONE  Follow-Up: At [facility], you and your health needs are our priority.  As part of our continuing mission to provide you with exceptional heart care, we have created designated Provider Care Teams.  These Care Teams include your primary Cardiologist (physician) and Advanced Practice Providers (APPs -  Physician Assistants and Nurse Practitioners) who all work together to provide you with the care you need, when you need it. You will need a follow up appointment in 3 months.  Please call our office 2 months in advance to schedule this appointment.  You may see DR. BERRY or one of the following Advanced Practice Providers on your designated Care Team:   Corine Shelter, PA-C [place], [place] . Marjie Skiff, PA-C

## 2018-02-16 NOTE — Progress Notes (Signed)
02/16/2018 Ruth PalmaGina M Gunderman   01/09/1981  [id number]  Primary Physician Richardean ChimeraMcComb, John, MD Primary Cardiologist: Runell Gess J  MD Milagros LollFACP, FACC, [place], [place]  HPI:  Ruth Hubbard is a 38 y.o. 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.  She was referred by Dr. Richardean ChimeraJohn McComb , her OB/GYN, for evaluation of a murmur and an abnormal echo.  She has no cardiac risk factors other than family history with both parents who had stents in their older years.  She does not smoke, she drinks socially although she does 3 to 4 cups of caffeine a day.  She is never had a heart attack or stroke.  She denies chest pain or shortness of breath.  She is had a long history of murmur and has palpitations that occur on a weekly basis.  She had a 2D echocardiogram performed 09/28/2017 that revealed trivial trivial MR and TR with mild AI and moderate right atrial enlargement.   No outpatient medications have been marked as taking for the 02/16/18 encounter (Office Visit) with Runell Gess,  J, MD.     No Known Allergies  Social History   Socioeconomic History  . Marital status: Single    Spouse name: Not on file  . Number of children: Not on file  . Years of education: Not on file  . Highest education level: Not on file  Occupational History  . Not on file  Social Needs  . Financial resource strain: Not on file  . Food insecurity:    Worry: Not on file    Inability: Not on file  . Transportation needs:    Medical: Not on file    Non-medical: Not on file  Tobacco Use  . Smoking status: Current Some Day Smoker  . Smokeless tobacco: Never Used  Substance and Sexual Activity  . Alcohol use: Yes  . Drug use: Not on file  . Sexual activity: Not on file  Lifestyle  . Physical activity:    Days per week: Not on file    Minutes per session: Not on file  . Stress: Not on file  Relationships  . Social connections:    Talks on phone: Not on file   Gets together: Not on file    Attends religious service: Not on file    Active member of club or organization: Not on file    Attends meetings of clubs or organizations: Not on file    Relationship status: Not on file  . Intimate partner violence:    Fear of current or ex partner: Not on file    Emotionally abused: Not on file    Physically abused: Not on file    Forced sexual activity: Not on file  Other Topics Concern  . Not on file  Social History Narrative  . Not on file     Review of Systems: General: negative for chills, fever, night sweats or weight changes.  Cardiovascular: negative for chest pain, dyspnea on exertion, edema, orthopnea, palpitations, paroxysmal nocturnal dyspnea or shortness of breath Dermatological: negative for rash Respiratory: negative for cough or wheezing Urologic: negative for hematuria Abdominal: negative for nausea, vomiting, diarrhea, bright red blood per rectum, melena, or hematemesis Neurologic: negative for visual changes, syncope, or dizziness All other systems reviewed and are otherwise negative except as noted above.    Blood pressure 130/76, pulse 78, height 5' 4.5" (1.638 m), weight 165 lb (74.8 kg).  General appearance:  alert and no distress Neck: no adenopathy, no carotid bruit, no JVD, supple, symmetrical, trachea midline and thyroid not enlarged, symmetric, no tenderness/mass/nodules Lungs: clear to auscultation bilaterally Heart: 1/6 outflow tract murmur Extremities: extremities normal, atraumatic, no cyanosis or edema Pulses: 2+ and symmetric Skin: Skin color, texture, turgor normal. No rashes or lesions Neurologic: Alert and oriented X 3, normal strength and tone. Normal symmetric reflexes. Normal coordination and gait  EKG normal sinus rhythm at 78 without ST or T wave changes.  I personally reviewed this EKG  ASSESSMENT AND PLAN:   Palpitations Ms Maxam has complaints  of palpitations that occur on a weekly basis.  She does  drink 3 to 4 cups of coffee a day.  She has no other symptoms from these.  I suggested that she discontinue caffeine intake and we reassess in 3 months prior to getting an event monitor.  Cardiac murmur Long history of cardiac murmur with recent echo performed 09/28/2017 that revealed mild AI, MR and TR moderate right atrial enlargement.  She does have a soft outflow tract murmur on exam which I am not concerned about.      Runell Gess MD FACP,FACC,FAHA, [facility] 02/16/2018 4:04 PM

## 2018-02-16 NOTE — Assessment & Plan Note (Signed)
Long history of cardiac murmur with recent echo performed 09/28/2017 that revealed mild AI, MR and TR moderate right atrial enlargement.  She does have a soft outflow tract murmur on exam which I am not concerned about.

## 2018-02-16 NOTE — Assessment & Plan Note (Signed)
Ruth Hubbard has complaints  of palpitations that occur on a weekly basis.  She does drink 3 to 4 cups of coffee a day.  She has no other symptoms from these.  I suggested that she discontinue caffeine intake and we reassess in 3 months prior to getting an event monitor.

## 2018-05-17 ENCOUNTER — Telehealth: Payer: Self-pay | Admitting: *Deleted

## 2018-05-17 NOTE — Telephone Encounter (Signed)
   Cardiac Questionnaire:    Since your last visit or hospitalization:    1. Have you been having new or worsening chest pain? NO   2. Have you been having new or worsening shortness of breath? NO 3. Have you been having new or worsening leg swelling, wt gain, or increase in abdominal girth (pants fitting more tightly)? Legs swell at times.   4. Have you had any passing out spells? NO    *A YES to any of these questions would result in the appointment being kept. *If all the answers to these questions are NO, we should indicate that given the current situation regarding the worldwide coronarvirus pandemic, at the recommendation of the CDC, we are looking to limit gatherings in our waiting area, and thus will reschedule their appointment beyond four weeks from today.   _____________   COVID-19 Pre-Screening Questions:  . Do you currently have a fever? NO (yes = cancel and refer to pcp for e-visit) . Have you recently travelled on a cruise, internationally, or to [place], [place], [place], [place], [place], or [place], [place] [employer]) ? NO (yes = cancel, stay home, monitor symptoms, and contact pcp or initiate e-visit if symptoms develop) . Have you been in contact with someone that is currently pending confirmation of Covid19 testing or has been confirmed to have the Covid19 virus?  NO (yes = cancel, stay home, away from tested individual, monitor symptoms, and contact pcp or initiate e-visit if symptoms develop) . Are you currently experiencing fatigue or cough? NO (yes = pt should be prepared to have a mask placed at the time of their visit).

## 2018-05-17 NOTE — Telephone Encounter (Signed)
error 

## 2018-05-18 ENCOUNTER — Telehealth: Payer: Self-pay | Admitting: *Deleted

## 2018-05-18 NOTE — Telephone Encounter (Signed)
Cardiac Questionnaire:                          Since your last visit or hospitalization:                          1. Have you been having new or worsening chest pain? NO                         2. Have you been having new or worsening shortness of breath? NO 3. Have you been having new or worsening leg swelling, wt gain, or increase in abdominal girth (pants fitting more tightly)? Legs swell at times.                         4. Have you had any passing out spells? NO                          *A YES to any of these questions would result in the appointment being kept. *If all the answers to these questions are NO, we should indicate that given the current situation regarding the worldwide coronarvirus pandemic, at the recommendation of the CDC, we are looking to limit gatherings in our waiting area, and thus will reschedule their appointment beyond four weeks from today.   _____________   COVID-19 Pre-Screening Questions:   Do you currently have a fever? NO (yes = cancel and refer to pcp for e-visit)  Have you recently travelled on a cruise, internationally, or to [place], [place], [place], [place], [place], or [place], [place] [employer]) ? NO (yes = cancel, stay home, monitor symptoms, and contact pcp or initiate e-visit if symptoms develop)  Have you been in contact with someone that is currently pending confirmation of Covid19 testing or has been confirmed to have the Covid19 virus?  NO (yes = cancel, stay home, away from tested individual, monitor symptoms, and contact pcp or initiate e-visit if symptoms develop)  Are you currently experiencing fatigue or cough? NO (yes = pt should be prepared to have a mask placed at the time of their visit).

## 2018-05-19 ENCOUNTER — Ambulatory Visit: Payer: BC Managed Care – PPO | Admitting: Cardiovascular Disease

## 2018-05-26 ENCOUNTER — Telehealth: Payer: Self-pay | Admitting: *Deleted

## 2018-05-26 NOTE — Telephone Encounter (Signed)
Left message 05/25/18 and  05/26/18 for patient to call and rescheduled 05/19/18 visit with Dr. Allyson Sabal.

## 2018-06-02 ENCOUNTER — Telehealth: Payer: Self-pay | Admitting: Cardiovascular Disease

## 2018-06-02 NOTE — Telephone Encounter (Signed)
  LVM for patient to call to schedule virtual visit with Dr Allyson Sabal on 06/07/18 or 06/09/18

## 2018-06-09 ENCOUNTER — Telehealth: Payer: Self-pay | Admitting: Cardiovascular Disease

## 2018-06-09 NOTE — Telephone Encounter (Signed)
LVM to call and reschedule appt

## 2019-01-23 ENCOUNTER — Encounter (HOSPITAL_BASED_OUTPATIENT_CLINIC_OR_DEPARTMENT_OTHER): Payer: Self-pay | Admitting: *Deleted

## 2019-01-23 ENCOUNTER — Inpatient Hospital Stay (HOSPITAL_BASED_OUTPATIENT_CLINIC_OR_DEPARTMENT_OTHER)
Admission: EM | Admit: 2019-01-23 | Discharge: 2019-02-06 | DRG: 330 | Disposition: A | Discharge status: Home / Self Care | Payer: BC Managed Care – PPO | Attending: Surgery | Admitting: Surgery

## 2019-01-23 ENCOUNTER — Other Ambulatory Visit: Payer: Self-pay

## 2019-01-23 ENCOUNTER — Emergency Department (HOSPITAL_BASED_OUTPATIENT_CLINIC_OR_DEPARTMENT_OTHER): Payer: BC Managed Care – PPO

## 2019-01-23 ENCOUNTER — Encounter: Payer: Self-pay | Admitting: Emergency Medicine

## 2019-01-23 ENCOUNTER — Emergency Department
Admission: EM | Admit: 2019-01-23 | Discharge: 2019-01-23 | Disposition: A | Discharge status: Home / Self Care | Payer: BC Managed Care – PPO | Source: Home / Self Care

## 2019-01-23 DIAGNOSIS — R1030 Lower abdominal pain, unspecified: Secondary | ICD-10-CM | POA: Diagnosis not present

## 2019-01-23 DIAGNOSIS — E441 Mild protein-calorie malnutrition: Secondary | ICD-10-CM | POA: Diagnosis not present

## 2019-01-23 DIAGNOSIS — R011 Cardiac murmur, unspecified: Secondary | ICD-10-CM | POA: Diagnosis present

## 2019-01-23 DIAGNOSIS — R1031 Right lower quadrant pain: Secondary | ICD-10-CM

## 2019-01-23 DIAGNOSIS — K219 Gastro-esophageal reflux disease without esophagitis: Secondary | ICD-10-CM | POA: Diagnosis present

## 2019-01-23 DIAGNOSIS — Z833 Family history of diabetes mellitus: Secondary | ICD-10-CM | POA: Diagnosis not present

## 2019-01-23 DIAGNOSIS — Z6825 Body mass index (BMI) 25.0-25.9, adult: Secondary | ICD-10-CM | POA: Diagnosis not present

## 2019-01-23 DIAGNOSIS — Z823 Family history of stroke: Secondary | ICD-10-CM

## 2019-01-23 DIAGNOSIS — Z5331 Laparoscopic surgical procedure converted to open procedure: Secondary | ICD-10-CM | POA: Diagnosis not present

## 2019-01-23 DIAGNOSIS — G43909 Migraine, unspecified, not intractable, without status migrainosus: Secondary | ICD-10-CM | POA: Diagnosis not present

## 2019-01-23 DIAGNOSIS — Z20828 Contact with and (suspected) exposure to other viral communicable diseases: Secondary | ICD-10-CM | POA: Diagnosis present

## 2019-01-23 DIAGNOSIS — K3580 Unspecified acute appendicitis: Secondary | ICD-10-CM | POA: Diagnosis present

## 2019-01-23 DIAGNOSIS — K9189 Other postprocedural complications and disorders of digestive system: Secondary | ICD-10-CM | POA: Diagnosis not present

## 2019-01-23 DIAGNOSIS — K3533 Acute appendicitis with perforation and localized peritonitis, with abscess: Principal | ICD-10-CM | POA: Diagnosis present

## 2019-01-23 DIAGNOSIS — Z8249 Family history of ischemic heart disease and other diseases of the circulatory system: Secondary | ICD-10-CM

## 2019-01-23 DIAGNOSIS — K567 Ileus, unspecified: Secondary | ICD-10-CM | POA: Diagnosis not present

## 2019-01-23 DIAGNOSIS — F17200 Nicotine dependence, unspecified, uncomplicated: Secondary | ICD-10-CM | POA: Diagnosis present

## 2019-01-23 DIAGNOSIS — R1110 Vomiting, unspecified: Secondary | ICD-10-CM

## 2019-01-23 DIAGNOSIS — Z20822 Contact with and (suspected) exposure to covid-19: Secondary | ICD-10-CM

## 2019-01-23 DIAGNOSIS — K35211 Acute appendicitis with generalized peritonitis, with perforation and abscess: Secondary | ICD-10-CM | POA: Diagnosis present

## 2019-01-23 DIAGNOSIS — K353 Acute appendicitis with localized peritonitis, without perforation or gangrene: Secondary | ICD-10-CM

## 2019-01-23 DIAGNOSIS — K37 Unspecified appendicitis: Secondary | ICD-10-CM | POA: Diagnosis present

## 2019-01-23 DIAGNOSIS — K3521 Acute appendicitis with generalized peritonitis, with abscess: Secondary | ICD-10-CM | POA: Diagnosis present

## 2019-01-23 HISTORY — DX: Nontoxic goiter, unspecified: E04.9

## 2019-01-23 LAB — URINALYSIS, ROUTINE W REFLEX MICROSCOPIC
Bilirubin Urine: NEGATIVE
Glucose, UA: NEGATIVE mg/dL
Hgb urine dipstick: NEGATIVE
Ketones, ur: NEGATIVE mg/dL
Leukocytes,Ua: NEGATIVE
Nitrite: NEGATIVE
Protein, ur: NEGATIVE mg/dL
Specific Gravity, Urine: 1.02 (ref 1.005–1.030)
pH: 7.5 (ref 5.0–8.0)

## 2019-01-23 LAB — POCT URINALYSIS DIP (MANUAL ENTRY)
Blood, UA: NEGATIVE
Glucose, UA: NEGATIVE mg/dL
Ketones, POC UA: NEGATIVE mg/dL
Leukocytes, UA: NEGATIVE
Nitrite, UA: NEGATIVE
Protein Ur, POC: 30 mg/dL — AB
Spec Grav, UA: 1.025 (ref 1.010–1.025)
Urobilinogen, UA: 0.2 E.U./dL
pH, UA: 8.5 — AB (ref 5.0–8.0)

## 2019-01-23 LAB — COMPREHENSIVE METABOLIC PANEL
ALT: 16 U/L (ref 0–44)
AST: 15 U/L (ref 15–41)
Albumin: 3.7 g/dL (ref 3.5–5.0)
Alkaline Phosphatase: 71 U/L (ref 38–126)
Anion gap: 7 (ref 5–15)
BUN: 8 mg/dL (ref 6–20)
CO2: 25 mmol/L (ref 22–32)
Calcium: 9.1 mg/dL (ref 8.9–10.3)
Chloride: 106 mmol/L (ref 98–111)
Creatinine, Ser: 0.56 mg/dL (ref 0.44–1.00)
GFR calc Af Amer: >60 mL/min (ref >60)
GFR calc non Af Amer: >60 mL/min (ref >60)
Glucose, Bld: 87 mg/dL (ref 70–99)
Potassium: 3.8 mmol/L (ref 3.5–5.1)
Sodium: 138 mmol/L (ref 135–145)
Total Bilirubin: 0.4 mg/dL (ref 0.3–1.2)
Total Protein: 6.8 g/dL (ref 6.5–8.1)

## 2019-01-23 LAB — CBC WITH DIFFERENTIAL/PLATELET
Abs Immature Granulocytes: 0.15 10*3/uL — ABNORMAL HIGH (ref 0.00–0.07)
Basophils Absolute: 0.1 10*3/uL (ref 0.0–0.1)
Basophils Relative: 0 %
Eosinophils Absolute: 0.2 10*3/uL (ref 0.0–0.5)
Eosinophils Relative: 2 %
HCT: 41 % (ref 36.0–46.0)
Hemoglobin: 13.7 g/dL (ref 12.0–15.0)
Immature Granulocytes: 1 %
Lymphocytes Relative: 28 %
Lymphs Abs: 4 10*3/uL (ref 0.7–4.0)
MCH: 30.2 pg (ref 26.0–34.0)
MCHC: 33.4 g/dL (ref 30.0–36.0)
MCV: 90.5 fL (ref 80.0–100.0)
Monocytes Absolute: 1.1 10*3/uL — ABNORMAL HIGH (ref 0.1–1.0)
Monocytes Relative: 8 %
Neutro Abs: 8.5 10*3/uL — ABNORMAL HIGH (ref 1.7–7.7)
Neutrophils Relative %: 61 %
Platelets: 323 10*3/uL (ref 150–400)
RBC: 4.53 MIL/uL (ref 3.87–5.11)
RDW: 14.9 % (ref 11.5–15.5)
WBC: 14 10*3/uL — ABNORMAL HIGH (ref 4.0–10.5)
nRBC: 0 % (ref 0.0–0.2)

## 2019-01-23 LAB — LIPASE, BLOOD: Lipase: 40 U/L (ref 11–51)

## 2019-01-23 LAB — PREGNANCY, URINE: Preg Test, Ur: NEGATIVE

## 2019-01-23 MED ORDER — SODIUM CHLORIDE 0.9 % IV SOLN
1.0000 g | Freq: Once | INTRAVENOUS | Status: AC
  Administered 2019-01-23: 22:00:00 1 g via INTRAVENOUS
  Filled 2019-01-23: qty 10

## 2019-01-23 MED ORDER — SODIUM CHLORIDE 0.9 % IV SOLN
INTRAVENOUS | Status: AC
  Administered 2019-01-23: 22:00:00 via INTRAVENOUS

## 2019-01-23 MED ORDER — METRONIDAZOLE IN NACL 5-0.79 MG/ML-% IV SOLN
500.0000 mg | Freq: Once | INTRAVENOUS | Status: AC
  Administered 2019-01-23: 500 mg via INTRAVENOUS
  Filled 2019-01-23: qty 100

## 2019-01-23 MED ORDER — IOHEXOL 300 MG/ML  SOLN
100.0000 mL | Freq: Once | INTRAMUSCULAR | Status: AC | PRN
  Administered 2019-01-23: 19:00:00 100 mL via INTRAVENOUS

## 2019-01-23 NOTE — ED Provider Notes (Signed)
Ruth Hubbard CARE    CSN: [id number] Arrival date & time: 01/23/19  1351      History   Chief Complaint Chief Complaint  Patient presents with  . Abdominal Pain    HPI FLOIS MCTAGUE is a 38 y.o. female.   The history is provided by the patient. No language interpreter was used.  Abdominal Pain Pain location:  RLQ Pain quality: aching   Pain radiates to:  Does not radiate Pain severity:  Mild Onset quality:  Gradual Chronicity:  New Relieved by:  Nothing Ineffective treatments:  None tried Associated symptoms: no anorexia and no fever   Risk factors: no alcohol abuse and has not had multiple surgeries    Pt reports she had diffuse abdominal pain for several days.  Pt now has soreness in her right lower quadrant.  Pt also request a covid test.  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 Acid reflux   . Bronchitis   . Murmur   . Sinus trouble     Patient Active Problem List   Diagnosis Date Noted  . Palpitations 02/16/2018  . Cardiac murmur 02/16/2018    History reviewed. No pertinent surgical history.  OB History   No obstetric history on file.      Home Medications    Prior to Admission medications   Not on File    Family History Family History  Problem Relation Age of Onset  . Diabetes Mother   . Hypertension Mother   . Stroke Mother   . Hypertension Father     Social History Social History   Tobacco Use  . Smoking status: Current Some Day Smoker  . Smokeless tobacco: Never Used  Substance Use Topics  . Alcohol use: Yes  . Drug use: Not on file     Allergies   Patient has no known allergies.   Review of Systems Review of Systems  Constitutional: Negative for fever.  Gastrointestinal: Positive for abdominal pain. Negative for anorexia.  All other systems reviewed and are negative.    Physical Exam Triage Vital Signs ED Triage Vitals  Enc  Vitals Group     BP 01/23/19 1535 131/86     Pulse Rate 01/23/19 1535 61     Resp --      Temp 01/23/19 1535 97.9 F (36.6 C)     Temp Source 01/23/19 1535 Oral     SpO2 01/23/19 1535 97 %     Weight 01/23/19 1536 151 lb (68.5 kg)     Height 01/23/19 1536 5\' 4"  (1.626 m)     Head Circumference --      Peak Flow --      Pain Score 01/23/19 1536 0     Pain Loc --      Pain Edu? --      Excl. in GC? --    No data found.  Updated Vital Signs BP 131/86 (BP Location: Right Arm)   Pulse 61   Temp 97.9 F (36.6 C) (Oral)   Ht 5\' 4"  (1.626 m)   Wt 68.5 kg   LMP 01/23/2016   SpO2 97%   BMI 25.92 kg/m   Visual Acuity Right Eye Distance:   Left Eye Distance:   Bilateral Distance:    Right Eye Near:   Left Eye Near:    Bilateral Near:     Physical Exam Vitals signs  and nursing note reviewed.  Constitutional:      Appearance: She is well-developed.  HENT:     Head: Normocephalic.  Neck:     Musculoskeletal: Normal range of motion.  Cardiovascular:     Rate and Rhythm: Normal rate.  Pulmonary:     Effort: Pulmonary effort is normal.  Abdominal:     General: Abdomen is flat. Bowel sounds are normal. There is no distension.     Palpations: Abdomen is soft.     Tenderness: There is abdominal tenderness in the right lower quadrant.  Musculoskeletal: Normal range of motion.  Skin:    General: Skin is warm.     Capillary Refill: Capillary refill takes less than 2 seconds.  Neurological:     Mental Status: She is alert and oriented to person, place, and time.      UC Treatments / Results  Labs (all labs ordered are listed, but only abnormal results are displayed) Labs Reviewed  POCT URINALYSIS DIP (MANUAL ENTRY) - Abnormal; Notable for the following components:      Result Value   Color, UA brown (*)    Bilirubin, UA small (*)    pH, UA 8.5 (*)    Protein Ur, POC =30 (*)    All other components within normal limits  NOVEL CORONAVIRUS, NAA    EKG    Radiology No results found.  Procedures Procedures (including critical care time)  Medications Ordered in UC Medications - No data to display  Initial Impression / Assessment and Plan / UC Course  I have reviewed the triage vital signs and the nursing notes.  Pertinent labs & imaging results that were available during my care of the patient were reviewed by me and considered in my medical decision making (see chart for details).     Covid sendout test pending. Pt needs ct of her abdomen.  Pt distressed about possible ED wait.  Pt advised [facility] for ct.  Final Clinical Impressions(s) / UC Diagnoses   Final diagnoses:  Exposure to COVID-19 virus  Lower abdominal pain  Right lower quadrant abdominal pain     Discharge Instructions     Go to [facility] for evaluation.  Your Covid test is pending    ED Prescriptions    None     PDMP not reviewed this encounter.  An After Visit Summary was printed and given to the patient.    Fransico Meadow, [place] 01/23/19 1628

## 2019-01-23 NOTE — ED Triage Notes (Signed)
Abdominal pain, lower right x 10 days ago better 2 days ago. Denies nausea, vomiting, diarrhea, polyuria, dysuria.

## 2019-01-23 NOTE — ED Provider Notes (Signed)
[facility]ERGENCY DEPARTMENT Provider Note   CSN: [id number] Arrival date & time: 01/23/19  1717     History   Chief Complaint Chief Complaint  Patient presents with  . Abdominal Pain    HPI Ruth Hubbard is a 38 y.o. female.     Patient is a 38 year old female who presents with abdominal pain.  She has had about a 10-day history of pain in her right lower abdomen.  It started in her right lower back and now is persistent in her right lower abdomen.  However it waxes and wanes in intensity and is eased off at this point.  She denies any vaginal bleeding or discharge.  No urinary symptoms.  No associated nausea or vomiting.  No known fevers.  No change in bowels.  No prior abdominal surgeries.     Past Medical History:  Diagnosis Date  . Acid reflux   . Bronchitis   . Murmur   . Sinus trouble     Patient Active Problem List   Diagnosis Date Noted  . Appendicitis 01/23/2019  . Palpitations 02/16/2018  . Cardiac murmur 02/16/2018    History reviewed. No pertinent surgical history.   OB History   No obstetric history on file.      Home Medications    Prior to Admission medications   Not on File    Family History Family History  Problem Relation Age of Onset  . Diabetes Mother   . Hypertension Mother   . Stroke Mother   . Hypertension Father     Social History Social History   Tobacco Use  . Smoking status: Current Some Day Smoker  . Smokeless tobacco: Never Used  Substance Use Topics  . Alcohol use: Yes  . Drug use: Not on file     Allergies   Patient has no known allergies.   Review of Systems Review of Systems  Constitutional: Negative for chills, diaphoresis, fatigue and fever.  HENT: Negative for congestion, rhinorrhea and sneezing.   Eyes: Negative.   Respiratory: Negative for cough, chest tightness and shortness of breath.   Cardiovascular: Negative for chest pain and leg swelling.  Gastrointestinal: Positive for  abdominal pain. Negative for blood in stool, diarrhea, nausea and vomiting.  Genitourinary: Negative for difficulty urinating, flank pain, frequency and hematuria.  Musculoskeletal: Positive for back pain. Negative for arthralgias.  Skin: Negative for rash.  Neurological: Negative for dizziness, speech difficulty, weakness, numbness and headaches.     Physical Exam Updated Vital Signs BP 103/61 (BP Location: Left Arm)   Pulse 72   Temp 98.9 F (37.2 C) (Oral)   Resp 16   Ht 5\' 4"  (1.626 m)   Wt 68 kg   SpO2 99%   BMI 25.73 kg/m   Physical Exam Constitutional:      Appearance: She is well-developed.  HENT:     Head: Normocephalic and atraumatic.  Eyes:     Pupils: Pupils are equal, round, and reactive to light.  Neck:     Musculoskeletal: Normal range of motion and neck supple.  Cardiovascular:     Rate and Rhythm: Normal rate and regular rhythm.     Heart sounds: Normal heart sounds.  Pulmonary:     Effort: Pulmonary effort is normal. No respiratory distress.     Breath sounds: Normal breath sounds. No wheezing or rales.  Chest:     Chest wall: No tenderness.  Abdominal:     General: Bowel sounds are normal.  Palpations: Abdomen is soft.     Tenderness: There is abdominal tenderness in the right lower quadrant. There is no guarding or rebound.  Genitourinary:    Comments: Pelvic exam shows slight, physiologic discharge, no cervical motion tenderness, no adnexal tenderness Musculoskeletal: Normal range of motion.  Lymphadenopathy:     Cervical: No cervical adenopathy.  Skin:    General: Skin is warm and dry.     Findings: No rash.  Neurological:     Mental Status: She is alert and oriented to person, place, and time.      ED Treatments / Results  Labs (all labs ordered are listed, but only abnormal results are displayed) Labs Reviewed  CBC WITH DIFFERENTIAL/PLATELET - Abnormal; Notable for the following components:      Result Value   WBC 14.0 (*)     Neutro Abs 8.5 (*)    Monocytes Absolute 1.1 (*)    Abs Immature Granulocytes 0.15 (*)    All other components within normal limits  SARS CORONAVIRUS 2 (TAT 6-24 HRS)  URINALYSIS, ROUTINE W REFLEX MICROSCOPIC  PREGNANCY, URINE  COMPREHENSIVE METABOLIC PANEL  LIPASE, BLOOD    EKG None  Radiology Ct Abdomen Pelvis W Contrast  Result Date: 01/23/2019 CLINICAL DATA:  Abdominal pain with appendicitis suspected. EXAM: CT ABDOMEN AND PELVIS WITH CONTRAST TECHNIQUE: Multidetector CT imaging of the abdomen and pelvis was performed using the standard protocol following bolus administration of intravenous contrast. CONTRAST:  100mL OMNIPAQUE IOHEXOL 300 MG/ML  SOLN COMPARISON:  None. FINDINGS: Lower chest: The lung bases are clear. The heart size is normal. Hepatobiliary: The liver is normal. Normal gallbladder.There is no biliary ductal dilation. Pancreas: Normal contours without ductal dilatation. No peripancreatic fluid collection. Spleen: No splenic laceration or hematoma. Adrenals/Urinary Tract: --Adrenal glands: No adrenal hemorrhage. --Right kidney/ureter: No hydronephrosis or perinephric hematoma. --Left kidney/ureter: No hydronephrosis or perinephric hematoma. --Urinary bladder: Unremarkable. Stomach/Bowel: --Stomach/Duodenum: No hiatal hernia or other gastric abnormality. Normal duodenal course and caliber. --Small bowel: No dilatation or inflammation. --Colon: No focal abnormality. --Appendix: There are significant periappendiceal inflammatory changes. The appendix is dilated measuring up to approximately 2.3 cm. Multiple appendicoliths are noted within the distal appendix. There is some free fluid in the right lower quadrant without evidence for well-formed drainable collection. There is no free air. Vascular/Lymphatic: Atherosclerotic calcification is present within the non-aneurysmal abdominal aorta, without hemodynamically significant stenosis. --No retroperitoneal lymphadenopathy. --there are  multiple enlarged lymph nodes in the right lower quadrant, presumably reactive in etiology. --No pelvic or inguinal lymphadenopathy. Reproductive: Unremarkable Other: There is a small amount of free fluid in the patient's pelvis, presumably reactive the abdominal wall is normal. Musculoskeletal. No acute displaced fractures. IMPRESSION: 1. Findings are consistent with acute uncomplicated appendicitis. 2. Multiple enlarged right lower quadrant lymph nodes, presumably reactive in etiology. 3. Atherosclerotic changes of the abdominal aorta, greater than expected for a patient of this age. Aortic Atherosclerosis (ICD10-I70.0). Electronically Signed   By: Katherine Mantlehristopher  Green M.D.   On: 01/23/2019 19:24    Procedures Procedures (including critical care time)  Medications Ordered in ED Medications  0.9 %  sodium chloride infusion (has no administration in time range)  cefTRIAXone (ROCEPHIN) 1 g in sodium chloride 0.9 % 100 mL IVPB (has no administration in time range)  metroNIDAZOLE (FLAGYL) IVPB 500 mg (has no administration in time range)  iohexol (OMNIPAQUE) 300 MG/ML solution 100 mL (100 mLs Intravenous Contrast Given 01/23/19 1908)     Initial Impression / Assessment and Plan / ED  Course  I have reviewed the triage vital signs and the nursing notes.  Pertinent labs & imaging results that were available during my care of the patient were reviewed by me and considered in my medical decision making (see chart for details).       Patient is a 38 year old female who presents with worsening pain to her lower abdomen.  CT scan shows acute appendicitis without evidence of abscess or perforation.  She is otherwise well-appearing.  She was given Rocephin and Flagyl.  I spoke with Dr. Sheliah Hatch with general surgery who will admit the patient to [facility].  Plan is to have her appendectomy in the morning.  Final Clinical Impressions(s) / ED Diagnoses   Final diagnoses:  Acute appendicitis with  localized peritonitis, without perforation, abscess, or gangrene    ED Discharge Orders    None       Rolan Bucco, MD 01/23/19 2120

## 2019-01-23 NOTE — ED Triage Notes (Signed)
Sent here from [facility] for abd ct , c/o abd pain x 1 week

## 2019-01-23 NOTE — Discharge Instructions (Addendum)
Go to [facility] for evaluation.  Your Covid test is pending

## 2019-01-24 ENCOUNTER — Observation Stay (HOSPITAL_COMMUNITY): Payer: BC Managed Care – PPO | Admitting: Anesthesiology

## 2019-01-24 ENCOUNTER — Encounter (HOSPITAL_COMMUNITY): Admission: EM | Disposition: A | Discharge status: Home / Self Care | Payer: Self-pay | Source: Home / Self Care

## 2019-01-24 ENCOUNTER — Encounter (HOSPITAL_COMMUNITY): Payer: Self-pay | Admitting: Anesthesiology

## 2019-01-24 DIAGNOSIS — K3521 Acute appendicitis with generalized peritonitis, with abscess: Secondary | ICD-10-CM | POA: Diagnosis present

## 2019-01-24 DIAGNOSIS — K3580 Unspecified acute appendicitis: Secondary | ICD-10-CM | POA: Diagnosis present

## 2019-01-24 HISTORY — PX: LAPAROSCOPIC APPENDECTOMY: SHX408

## 2019-01-24 LAB — CBC
HCT: 38.9 % (ref 36.0–46.0)
Hemoglobin: 12.6 g/dL (ref 12.0–15.0)
MCH: 29.3 pg (ref 26.0–34.0)
MCHC: 32.4 g/dL (ref 30.0–36.0)
MCV: 90.5 fL (ref 80.0–100.0)
Platelets: 321 10*3/uL (ref 150–400)
RBC: 4.3 MIL/uL (ref 3.87–5.11)
RDW: 14.9 % (ref 11.5–15.5)
WBC: 14.3 10*3/uL — ABNORMAL HIGH (ref 4.0–10.5)
nRBC: 0 % (ref 0.0–0.2)

## 2019-01-24 LAB — COMPREHENSIVE METABOLIC PANEL
ALT: 13 U/L (ref 0–44)
AST: 12 U/L — ABNORMAL LOW (ref 15–41)
Albumin: 3.2 g/dL — ABNORMAL LOW (ref 3.5–5.0)
Alkaline Phosphatase: 68 U/L (ref 38–126)
Anion gap: 8 (ref 5–15)
BUN: 6 mg/dL (ref 6–20)
CO2: 24 mmol/L (ref 22–32)
Calcium: 8.8 mg/dL — ABNORMAL LOW (ref 8.9–10.3)
Chloride: 107 mmol/L (ref 98–111)
Creatinine, Ser: 0.5 mg/dL (ref 0.44–1.00)
GFR calc Af Amer: >60 mL/min (ref >60)
GFR calc non Af Amer: >60 mL/min (ref >60)
Glucose, Bld: 103 mg/dL — ABNORMAL HIGH (ref 70–99)
Potassium: 4.3 mmol/L (ref 3.5–5.1)
Sodium: 139 mmol/L (ref 135–145)
Total Bilirubin: 0.3 mg/dL (ref 0.3–1.2)
Total Protein: 6.3 g/dL — ABNORMAL LOW (ref 6.5–8.1)

## 2019-01-24 LAB — HIV ANTIBODY (ROUTINE TESTING W REFLEX): HIV Screen 4th Generation wRfx: NONREACTIVE

## 2019-01-24 LAB — SURGICAL PCR SCREEN
MRSA, PCR: NEGATIVE
Staphylococcus aureus: NEGATIVE

## 2019-01-24 LAB — SARS CORONAVIRUS 2 (TAT 6-24 HRS): SARS Coronavirus 2: NEGATIVE

## 2019-01-24 LAB — RESPIRATORY PANEL BY RT PCR (FLU A&B, COVID)
Influenza A by PCR: NEGATIVE
Influenza B by PCR: NEGATIVE
SARS Coronavirus 2 by RT PCR: NEGATIVE

## 2019-01-24 SURGERY — APPENDECTOMY, LAPAROSCOPIC
Anesthesia: General | Site: Abdomen

## 2019-01-24 MED ORDER — ACETAMINOPHEN 650 MG RE SUPP: 650.0000 mg | Freq: Four times a day (QID) | RECTAL | Status: DC | PRN

## 2019-01-24 MED ORDER — KETOROLAC TROMETHAMINE 15 MG/ML IJ SOLN
15.0000 mg | INTRAMUSCULAR | Status: AC
  Administered 2019-01-24: 15 mg via INTRAVENOUS
  Filled 2019-01-24 (×2): qty 1

## 2019-01-24 MED ORDER — LACTATED RINGERS IV SOLN
INTRAVENOUS | Status: DC
  Administered 2019-01-24 (×2): via INTRAVENOUS

## 2019-01-24 MED ORDER — 0.9 % SODIUM CHLORIDE (POUR BTL) OPTIME
TOPICAL | Status: DC | PRN
  Administered 2019-01-24 (×3): 1000 mL

## 2019-01-24 MED ORDER — SUCCINYLCHOLINE CHLORIDE 200 MG/10ML IV SOSY
PREFILLED_SYRINGE | INTRAVENOUS | Status: AC
  Filled 2019-01-24: qty 10

## 2019-01-24 MED ORDER — FENTANYL CITRATE (PF) 100 MCG/2ML IJ SOLN
25.0000 ug | INTRAMUSCULAR | Status: DC | PRN
  Administered 2019-01-24 (×2): 50 ug via INTRAVENOUS

## 2019-01-24 MED ORDER — OXYCODONE HCL 5 MG PO TABS
5.0000 mg | ORAL_TABLET | Freq: Four times a day (QID) | ORAL | Status: DC | PRN
  Administered 2019-01-25 – 2019-01-30 (×9): 5 mg via ORAL
  Filled 2019-01-24 (×10): qty 1

## 2019-01-24 MED ORDER — SUGAMMADEX SODIUM 200 MG/2ML IV SOLN
INTRAVENOUS | Status: DC | PRN
  Administered 2019-01-24: 200 mg via INTRAVENOUS

## 2019-01-24 MED ORDER — MORPHINE SULFATE (PF) 4 MG/ML IV SOLN
2.0000 mg | INTRAVENOUS | Status: DC | PRN
  Administered 2019-01-24 (×2): 2 mg via INTRAVENOUS
  Administered 2019-01-25: 4 mg via INTRAVENOUS
  Filled 2019-01-24 (×3): qty 1

## 2019-01-24 MED ORDER — MEPERIDINE HCL 50 MG/ML IJ SOLN: 6.2500 mg | INTRAMUSCULAR | Status: DC | PRN

## 2019-01-24 MED ORDER — OXYCODONE HCL 5 MG/5ML PO SOLN: 5.0000 mg | Freq: Once | ORAL | Status: DC | PRN

## 2019-01-24 MED ORDER — MIDAZOLAM HCL 2 MG/2ML IJ SOLN
INTRAMUSCULAR | Status: DC | PRN
  Administered 2019-01-24: 2 mg via INTRAVENOUS

## 2019-01-24 MED ORDER — KCL IN DEXTROSE-NACL 20-5-0.45 MEQ/L-%-% IV SOLN
INTRAVENOUS | Status: DC
  Administered 2019-01-24: 03:00:00 via INTRAVENOUS
  Filled 2019-01-24 (×2): qty 1000

## 2019-01-24 MED ORDER — ACETAMINOPHEN 325 MG PO TABS: 325.0000 mg | ORAL_TABLET | ORAL | Status: DC | PRN

## 2019-01-24 MED ORDER — METHOCARBAMOL 1000 MG/10ML IJ SOLN
500.0000 mg | Freq: Three times a day (TID) | INTRAVENOUS | Status: DC | PRN
  Administered 2019-01-24 – 2019-01-25 (×2): 500 mg via INTRAVENOUS
  Filled 2019-01-24 (×2): qty 500

## 2019-01-24 MED ORDER — PROPOFOL 10 MG/ML IV BOLUS
INTRAVENOUS | Status: DC | PRN
  Administered 2019-01-24: 160 mg via INTRAVENOUS

## 2019-01-24 MED ORDER — FENTANYL CITRATE (PF) 250 MCG/5ML IJ SOLN
INTRAMUSCULAR | Status: DC | PRN
  Administered 2019-01-24 (×3): 50 ug via INTRAVENOUS
  Administered 2019-01-24: 100 ug via INTRAVENOUS
  Administered 2019-01-24 (×2): 50 ug via INTRAVENOUS

## 2019-01-24 MED ORDER — GABAPENTIN 300 MG PO CAPS
300.0000 mg | ORAL_CAPSULE | ORAL | Status: AC
  Administered 2019-01-24: 300 mg via ORAL
  Filled 2019-01-24: qty 1

## 2019-01-24 MED ORDER — MIDAZOLAM HCL 2 MG/2ML IJ SOLN
INTRAMUSCULAR | Status: AC
  Filled 2019-01-24: qty 2

## 2019-01-24 MED ORDER — MORPHINE SULFATE (PF) 2 MG/ML IV SOLN: 2.0000 mg | INTRAVENOUS | Status: DC | PRN

## 2019-01-24 MED ORDER — ONDANSETRON HCL 4 MG/2ML IJ SOLN: 4.0000 mg | Freq: Once | INTRAMUSCULAR | Status: DC | PRN

## 2019-01-24 MED ORDER — ACETAMINOPHEN 160 MG/5ML PO SOLN: 325.0000 mg | ORAL | Status: DC | PRN

## 2019-01-24 MED ORDER — CHLORHEXIDINE GLUCONATE CLOTH 2 % EX PADS
6.0000 | MEDICATED_PAD | Freq: Every day | CUTANEOUS | Status: DC
  Administered 2019-01-25: 6 via TOPICAL

## 2019-01-24 MED ORDER — ONDANSETRON HCL 4 MG/2ML IJ SOLN
INTRAMUSCULAR | Status: AC
  Filled 2019-01-24: qty 2

## 2019-01-24 MED ORDER — POLYETHYLENE GLYCOL 3350 17 G PO PACK: 17.0000 g | PACK | Freq: Every day | ORAL | Status: DC | PRN

## 2019-01-24 MED ORDER — PROPOFOL 10 MG/ML IV BOLUS
INTRAVENOUS | Status: AC
  Filled 2019-01-24: qty 20

## 2019-01-24 MED ORDER — LIDOCAINE 2% (20 MG/ML) 5 ML SYRINGE
INTRAMUSCULAR | Status: DC | PRN
  Administered 2019-01-24: 60 mg via INTRAVENOUS

## 2019-01-24 MED ORDER — SODIUM CHLORIDE 0.9 % IV SOLN
2.0000 g | INTRAVENOUS | Status: DC
  Administered 2019-01-24 – 2019-02-04 (×12): 2 g via INTRAVENOUS
  Filled 2019-01-24 (×7): qty 2
  Filled 2019-01-24: qty 20
  Filled 2019-01-24 (×2): qty 2
  Filled 2019-01-24: qty 20
  Filled 2019-01-24: qty 2

## 2019-01-24 MED ORDER — KETAMINE HCL 10 MG/ML IJ SOLN
INTRAMUSCULAR | Status: DC | PRN
  Administered 2019-01-24: 30 mg via INTRAVENOUS

## 2019-01-24 MED ORDER — FENTANYL CITRATE (PF) 100 MCG/2ML IJ SOLN
INTRAMUSCULAR | Status: AC
  Administered 2019-01-24: 50 ug via INTRAVENOUS
  Filled 2019-01-24: qty 4

## 2019-01-24 MED ORDER — ONDANSETRON HCL 4 MG/2ML IJ SOLN
4.0000 mg | Freq: Four times a day (QID) | INTRAMUSCULAR | Status: DC | PRN
  Administered 2019-01-26 – 2019-02-02 (×9): 4 mg via INTRAVENOUS
  Filled 2019-01-24 (×9): qty 2

## 2019-01-24 MED ORDER — OXYCODONE HCL 5 MG PO TABS: 5.0000 mg | ORAL_TABLET | Freq: Once | ORAL | Status: DC | PRN

## 2019-01-24 MED ORDER — FENTANYL CITRATE (PF) 250 MCG/5ML IJ SOLN
INTRAMUSCULAR | Status: AC
  Filled 2019-01-24: qty 5

## 2019-01-24 MED ORDER — MUPIROCIN 2 % EX OINT
1.0000 "application " | TOPICAL_OINTMENT | Freq: Two times a day (BID) | CUTANEOUS | Status: AC
  Administered 2019-01-25 – 2019-01-27 (×3): 1 via NASAL
  Filled 2019-01-24 (×4): qty 22

## 2019-01-24 MED ORDER — ROCURONIUM BROMIDE 10 MG/ML (PF) SYRINGE
PREFILLED_SYRINGE | INTRAVENOUS | Status: AC
  Filled 2019-01-24: qty 10

## 2019-01-24 MED ORDER — LIP MEDEX EX OINT
TOPICAL_OINTMENT | CUTANEOUS | Status: DC | PRN
  Administered 2019-01-25: via TOPICAL
  Filled 2019-01-24: qty 7

## 2019-01-24 MED ORDER — DEXAMETHASONE SODIUM PHOSPHATE 10 MG/ML IJ SOLN
INTRAMUSCULAR | Status: DC | PRN
  Administered 2019-01-24: 10 mg via INTRAVENOUS

## 2019-01-24 MED ORDER — METRONIDAZOLE IN NACL 5-0.79 MG/ML-% IV SOLN
500.0000 mg | Freq: Three times a day (TID) | INTRAVENOUS | Status: DC
  Administered 2019-01-24 – 2019-02-05 (×37): 500 mg via INTRAVENOUS
  Filled 2019-01-24 (×37): qty 100

## 2019-01-24 MED ORDER — KCL IN DEXTROSE-NACL 20-5-0.45 MEQ/L-%-% IV SOLN
INTRAVENOUS | Status: DC
  Administered 2019-01-25 – 2019-01-29 (×9): via INTRAVENOUS
  Filled 2019-01-24 (×13): qty 1000

## 2019-01-24 MED ORDER — ENSURE PRE-SURGERY PO LIQD
296.0000 mL | Freq: Once | ORAL | Status: AC
  Administered 2019-01-24: 296 mL via ORAL
  Filled 2019-01-24: qty 296

## 2019-01-24 MED ORDER — METOPROLOL TARTRATE 5 MG/5ML IV SOLN: 5.0000 mg | Freq: Four times a day (QID) | INTRAVENOUS | Status: DC | PRN

## 2019-01-24 MED ORDER — LACTATED RINGERS IR SOLN
Status: DC | PRN
  Administered 2019-01-24: 1000 mL

## 2019-01-24 MED ORDER — ONDANSETRON 4 MG PO TBDP: 4.0000 mg | ORAL_TABLET | Freq: Four times a day (QID) | ORAL | Status: DC | PRN

## 2019-01-24 MED ORDER — DEXAMETHASONE SODIUM PHOSPHATE 10 MG/ML IJ SOLN
INTRAMUSCULAR | Status: AC
  Filled 2019-01-24: qty 1

## 2019-01-24 MED ORDER — ONDANSETRON HCL 4 MG/2ML IJ SOLN
INTRAMUSCULAR | Status: DC | PRN
  Administered 2019-01-24: 4 mg via INTRAVENOUS

## 2019-01-24 MED ORDER — ACETAMINOPHEN 325 MG PO TABS: 650.0000 mg | ORAL_TABLET | Freq: Four times a day (QID) | ORAL | Status: DC | PRN

## 2019-01-24 MED ORDER — BUPIVACAINE-EPINEPHRINE (PF) 0.5% -1:200000 IJ SOLN
INTRAMUSCULAR | Status: AC
  Filled 2019-01-24: qty 1.8

## 2019-01-24 MED ORDER — BUPIVACAINE HCL 0.25 % IJ SOLN
INTRAMUSCULAR | Status: AC
  Filled 2019-01-24: qty 1

## 2019-01-24 MED ORDER — LIDOCAINE 2% (20 MG/ML) 5 ML SYRINGE
INTRAMUSCULAR | Status: AC
  Filled 2019-01-24: qty 5

## 2019-01-24 MED ORDER — ROCURONIUM BROMIDE 10 MG/ML (PF) SYRINGE
PREFILLED_SYRINGE | INTRAVENOUS | Status: DC | PRN
  Administered 2019-01-24 (×3): 10 mg via INTRAVENOUS
  Administered 2019-01-24: 50 mg via INTRAVENOUS
  Administered 2019-01-24: 10 mg via INTRAVENOUS

## 2019-01-24 MED ORDER — EPHEDRINE SULFATE-NACL 50-0.9 MG/10ML-% IV SOSY
PREFILLED_SYRINGE | INTRAVENOUS | Status: DC | PRN
  Administered 2019-01-24 (×2): 5 mg via INTRAVENOUS
  Administered 2019-01-24: 10 mg via INTRAVENOUS

## 2019-01-24 SURGICAL SUPPLY — 49 items
APPLIER CLIP ROT 10 11.4 M/L (STAPLE)
BLADE SURG SZ10 CARB STEEL (BLADE) ×3 IMPLANT
CABLE HIGH FREQUENCY MONO STRZ (ELECTRODE) ×3 IMPLANT
CELLS DAT CNTRL 66122 CELL SVR (MISCELLANEOUS) ×1 IMPLANT
CHLORAPREP W/TINT 26 (MISCELLANEOUS) ×3 IMPLANT
CLIP APPLIE ROT 10 11.4 M/L (STAPLE) IMPLANT
COVER WAND RF STERILE (DRAPES) IMPLANT
CUTTER FLEX LINEAR 45M (STAPLE) IMPLANT
DECANTER SPIKE VIAL GLASS SM (MISCELLANEOUS) ×3 IMPLANT
DERMABOND ADVANCED (GAUZE/BANDAGES/DRESSINGS) ×2
DERMABOND ADVANCED .7 DNX12 (GAUZE/BANDAGES/DRESSINGS) ×1 IMPLANT
DRSG OPSITE POSTOP 4X6 (GAUZE/BANDAGES/DRESSINGS) ×3 IMPLANT
DRSG TEGADERM 2-3/8X2-3/4 SM (GAUZE/BANDAGES/DRESSINGS) ×6 IMPLANT
ELECT BLADE TIP CTD 4 INCH (ELECTRODE) ×3 IMPLANT
ELECT REM PT RETURN 15FT ADLT (MISCELLANEOUS) ×3 IMPLANT
ENDOLOOP SUT PDS II  0 18 (SUTURE)
ENDOLOOP SUT PDS II 0 18 (SUTURE) IMPLANT
GAUZE SPONGE 2X2 8PLY STRL LF (GAUZE/BANDAGES/DRESSINGS) ×1 IMPLANT
GLOVE BIO SURGEON STRL SZ7.5 (GLOVE) ×3 IMPLANT
GOWN STRL REUS W/TWL XL LVL3 (GOWN DISPOSABLE) ×6 IMPLANT
HANDLE SUCTION POOLE (INSTRUMENTS) ×1 IMPLANT
KIT BASIN OR (CUSTOM PROCEDURE TRAY) ×3 IMPLANT
KIT TURNOVER KIT A (KITS) IMPLANT
PENCIL ELECTRO RS 15 CORD (MISCELLANEOUS) ×3 IMPLANT
PENCIL SMOKE EVACUATOR (MISCELLANEOUS) ×3 IMPLANT
POUCH SPECIMEN RETRIEVAL 10MM (ENDOMECHANICALS) ×3 IMPLANT
RELOAD 45 VASCULAR/THIN (ENDOMECHANICALS) IMPLANT
RELOAD PROXIMATE 75MM BLUE (ENDOMECHANICALS) ×6 IMPLANT
RELOAD STAPLE TA45 3.5 REG BLU (ENDOMECHANICALS) IMPLANT
RTRCTR WOUND ALEXIS 18CM MED (MISCELLANEOUS) ×3
SCISSORS LAP 5X35 DISP (ENDOMECHANICALS) ×3 IMPLANT
SET IRRIG TUBING LAPAROSCOPIC (IRRIGATION / IRRIGATOR) ×3 IMPLANT
SET TUBE SMOKE EVAC HIGH FLOW (TUBING) ×3 IMPLANT
SHEARS HARMONIC ACE PLUS 36CM (ENDOMECHANICALS) ×3 IMPLANT
SPONGE GAUZE 2X2 STER 10/PKG (GAUZE/BANDAGES/DRESSINGS) ×2
SPONGE LAP 18X18 RF (DISPOSABLE) ×3 IMPLANT
STAPLER GUN LINEAR PROX 60 (STAPLE) ×3 IMPLANT
STAPLER PROXIMATE 75MM BLUE (STAPLE) ×3 IMPLANT
SUCTION POOLE HANDLE (INSTRUMENTS) ×3
SUT MNCRL AB 4-0 PS2 18 (SUTURE) ×3 IMPLANT
SUT PDS AB 1 TP1 96 (SUTURE) ×6 IMPLANT
SUT SILK 2 0 SH CR/8 (SUTURE) ×6 IMPLANT
SYR BULB IRRIGATION 50ML (SYRINGE) ×3 IMPLANT
TOWEL OR 17X26 10 PK STRL BLUE (TOWEL DISPOSABLE) ×3 IMPLANT
TRAY FOLEY MTR SLVR 14FR STAT (SET/KITS/TRAYS/PACK) ×3 IMPLANT
TRAY LAPAROSCOPIC (CUSTOM PROCEDURE TRAY) ×3 IMPLANT
TROCAR BLADELESS OPT 5 100 (ENDOMECHANICALS) ×3 IMPLANT
TROCAR XCEL BLUNT TIP 100MML (ENDOMECHANICALS) ×3 IMPLANT
YANKAUER SUCT BULB TIP 10FT TU (MISCELLANEOUS) ×3 IMPLANT

## 2019-01-24 NOTE — Anesthesia Preprocedure Evaluation (Addendum)
Anesthesia Evaluation  Patient identified by MRN, date of birth, ID band Patient awake    Reviewed: Allergy & Precautions, H&P , NPO status , Patient's Chart, lab work & pertinent test results, reviewed documented beta blocker date and time   Airway Mallampati: II  TM Distance: >3 FB Neck ROM: full    Dental no notable dental hx. (+) Teeth Intact, Dental Advisory Given   Pulmonary neg pulmonary ROS, Current Smoker,    Pulmonary exam normal breath sounds clear to auscultation       Cardiovascular Exercise Tolerance: Good + Valvular Problems/Murmurs  Rhythm:regular Rate:Normal     Neuro/Psych negative neurological ROS  negative psych ROS   GI/Hepatic Neg liver ROS, GERD  ,  Endo/Other  negative endocrine ROS  Renal/GU negative Renal ROS  negative genitourinary   Musculoskeletal   Abdominal   Peds  Hematology negative hematology ROS (+)   Anesthesia Other Findings   Reproductive/Obstetrics negative OB ROS                            Anesthesia Physical Anesthesia Plan  ASA: II and emergent  Anesthesia Plan: General   Post-op Pain Management:    Induction: Intravenous and Cricoid pressure planned  PONV Risk Score and Plan: 3 and Ondansetron, Dexamethasone, Treatment may vary due to age or medical condition and Midazolam  Airway Management Planned: Oral ETT  Additional Equipment:   Intra-op Plan:   Post-operative Plan: Extubation in OR  Informed Consent: I have reviewed the patients History and Physical, chart, labs and discussed the procedure including the risks, benefits and alternatives for the proposed anesthesia with the patient or authorized representative who has indicated his/her understanding and acceptance.     Dental Advisory Given  Plan Discussed with: CRNA, Anesthesiologist and Surgeon  Anesthesia Plan Comments: (  )        Anesthesia Quick Evaluation

## 2019-01-24 NOTE — Progress Notes (Signed)
Patient having cramping pain; Dr Barry Dienes paged. Donne Hazel, RN

## 2019-01-24 NOTE — ED Notes (Signed)
[facility] arrived to transport pt to [facility] 

## 2019-01-24 NOTE — H&P (Addendum)
[facility] Surgery Consult/Admission Note  Ruth Hubbard 1980/10/21  [id number].    Requesting Provider: Cheron Schaumann, PA-C Chief Complaint/Reason for Consult: appendicitis  HPI:  Patient is an otherwise healthy 38 year old female who presented to the emergency department with complaints of abdominal pain.  Patient states pain started roughly 10 days ago in her right lower back and lower abdomen.  Pain was mild but intermittently severe.  Pain actually resolved roughly 3 days ago but returned.  Pain is now in her RLQ, severe, constant, nonradiating, movement makes it worse, pain medicine given here helps her pain.  No associated symptoms.  No history of abdominal surgeries.  No anticoagulation.  ROS:  Review of Systems  Constitutional: Negative for chills, diaphoresis and fever.  HENT: Negative for sore throat.   Respiratory: Negative for cough and shortness of breath.   Cardiovascular: Negative for chest pain.  Gastrointestinal: Positive for abdominal pain. Negative for blood in stool, constipation, diarrhea, nausea and vomiting.  Genitourinary: Negative for dysuria.  Skin: Negative for rash.  Neurological: Negative for dizziness and loss of consciousness.  All other systems reviewed and are negative.    Family History  Problem Relation Age of Onset  . Diabetes Mother   . Hypertension Mother   . Stroke Mother   . Hypertension Father     Past Medical History:  Diagnosis Date  . Acid reflux   . Bronchitis   . Murmur   . Sinus trouble     History reviewed. No pertinent surgical history.  Social History:  reports that she has been smoking. She has never used smokeless tobacco. She reports current alcohol use. No history on file for drug.  Allergies: No Known Allergies  Medications Prior to Admission  Medication Sig Dispense Refill  . ascorbic acid (VITAMIN C) 250 MG CHEW Chew 250 mg by mouth daily.    . Biotin w/ Vitamins C & E (HAIR SKIN & NAILS GUMMIES)  1250-7.5-7.5 MCG-MG-UNT CHEW Chew 1 tablet by mouth daily.    . Brimonidine Tartrate (LUMIFY) 0.025 % SOLN Place 1 drop into both eyes daily.    . Cyanocobalamin (VITAMIN B-12 ER PO) Take 1 tablet by mouth daily.    Marland Kitchen VITAMIN E PO Take 1 capsule by mouth daily.      Blood pressure (!) 111/55, pulse 76, temperature 99.3 F (37.4 C), temperature source Oral, resp. rate 16, height 5\' 4"  (1.626 m), weight 68 kg, SpO2 98 %.  Physical Exam Constitutional:      General: She is not in acute distress.    Appearance: Normal appearance. She is not diaphoretic.  HENT:     Head: Normocephalic and atraumatic.     Nose: Nose normal.     Mouth/Throat:     Comments: Patient wearing mask Eyes:     General: No scleral icterus.       Right eye: No discharge.        Left eye: No discharge.     Conjunctiva/sclera: Conjunctivae normal.     Pupils: Pupils are equal, round, and reactive to light.  Neck:     Musculoskeletal: Normal range of motion and neck supple.  Cardiovascular:     Rate and Rhythm: Normal rate and regular rhythm.     Pulses:          Posterior tibial pulses are 2+ on the right side and 2+ on the left side.     Heart sounds: Normal heart sounds. No murmur.  Pulmonary:  Effort: Pulmonary effort is normal. No respiratory distress.     Breath sounds: Normal breath sounds. No wheezing, rhonchi or rales.  Abdominal:     General: Bowel sounds are normal. There is no distension.     Palpations: Abdomen is soft. Abdomen is not rigid.     Tenderness: There is abdominal tenderness in the right lower quadrant. There is guarding. Positive signs include McBurney's sign.  Musculoskeletal: Normal range of motion.        General: No tenderness or deformity.     Right lower leg: No edema.     Left lower leg: No edema.  Skin:    General: Skin is warm and dry.     Findings: No rash.  Neurological:     Mental Status: She is alert and oriented to person, place, and time.  Psychiatric:         Mood and Affect: Mood normal.        Behavior: Behavior normal.     Results for orders placed or performed during the hospital encounter of 01/23/19 (from the past 48 hour(s))  Urinalysis, Routine w reflex microscopic     Status: None   Collection Time: 01/23/19  5:32 PM  Result Value Ref Range   Color, Urine YELLOW YELLOW   APPearance CLEAR CLEAR   Specific Gravity, Urine 1.020 1.005 - 1.030   pH 7.5 5.0 - 8.0   Glucose, UA NEGATIVE NEGATIVE mg/dL   Hgb urine dipstick NEGATIVE NEGATIVE   Bilirubin Urine NEGATIVE NEGATIVE   Ketones, ur NEGATIVE NEGATIVE mg/dL   Protein, ur NEGATIVE NEGATIVE mg/dL   Nitrite NEGATIVE NEGATIVE   Leukocytes,Ua NEGATIVE NEGATIVE    Comment: Microscopic not done on urines with negative protein, blood, leukocytes, nitrite, or glucose < 500 mg/dL. Performed at [facility], [address] [place], [place] [address]   Pregnancy, urine     Status: None   Collection Time: 01/23/19  5:32 PM  Result Value Ref Range   Preg Test, Ur NEGATIVE NEGATIVE    Comment:        THE SENSITIVITY OF THIS METHODOLOGY IS >20 mIU/mL. Performed at [facility], [address] [place], [place] [address]   Comprehensive metabolic panel     Status: None   Collection Time: 01/23/19  6:08 PM  Result Value Ref Range   Sodium 138 135 - 145 mmol/L   Potassium 3.8 3.5 - 5.1 mmol/L   Chloride 106 98 - 111 mmol/L   CO2 25 22 - 32 mmol/L   Glucose, Bld 87 70 - 99 mg/dL   BUN 8 6 - 20 mg/dL   Creatinine, Ser 8.29 0.44 - 1.00 mg/dL   Calcium 9.1 8.9 - 56.2 mg/dL   Total Protein 6.8 6.5 - 8.1 g/dL   Albumin 3.7 3.5 - 5.0 g/dL   AST 15 15 - 41 U/L   ALT 16 0 - 44 U/L   Alkaline Phosphatase 71 38 - 126 U/L   Total Bilirubin 0.4 0.3 - 1.2 mg/dL   GFR calc non Af Amer >60 >60 mL/min   GFR calc Af Amer >60 >60 mL/min   Anion gap 7 5 - 15    Comment: Performed at [facility], [address] [place], [place] [address]  Lipase, blood     Status:  None   Collection Time: 01/23/19  6:08 PM  Result Value Ref Range   Lipase 40 11 - 51 U/L  Comment: Performed at [facility], [address] [place], [place] [address]  CBC with Differential     Status: Abnormal   Collection Time: 01/23/19  6:08 PM  Result Value Ref Range   WBC 14.0 (H) 4.0 - 10.5 K/uL   RBC 4.53 3.87 - 5.11 MIL/uL   Hemoglobin 13.7 12.0 - 15.0 g/dL   HCT 41.0 36.0 - 46.0 %   MCV 90.5 80.0 - 100.0 fL   MCH 30.2 26.0 - 34.0 pg   MCHC 33.4 30.0 - 36.0 g/dL   RDW 14.9 11.5 - 15.5 %   Platelets 323 150 - 400 K/uL   nRBC 0.0 0.0 - 0.2 %   Neutrophils Relative % 61 %   Neutro Abs 8.5 (H) 1.7 - 7.7 K/uL   Lymphocytes Relative 28 %   Lymphs Abs 4.0 0.7 - 4.0 K/uL   Monocytes Relative 8 %   Monocytes Absolute 1.1 (H) 0.1 - 1.0 K/uL   Eosinophils Relative 2 %   Eosinophils Absolute 0.2 0.0 - 0.5 K/uL   Basophils Relative 0 %   Basophils Absolute 0.1 0.0 - 0.1 K/uL   Immature Granulocytes 1 %   Abs Immature Granulocytes 0.15 (H) 0.00 - 0.07 K/uL    Comment: Performed at [facility], [address] [place], [place] [address]  Surgical PCR screen     Status: None   Collection Time: 01/24/19  2:31 AM   Specimen: Nasal Mucosa; Nasal Swab  Result Value Ref Range   MRSA, PCR NEGATIVE NEGATIVE   Staphylococcus aureus NEGATIVE NEGATIVE    Comment: (NOTE) The Xpert SA Assay (FDA approved for NASAL specimens in patients 62 years of age and older), is one component of a comprehensive surveillance program. It is not intended to diagnose infection nor to guide or monitor treatment. Performed at [facility], [place] [address], [place], [place] [address]   Comprehensive metabolic panel     Status: Abnormal   Collection Time: 01/24/19  4:46 AM  Result Value Ref Range   Sodium 139 135 - 145 mmol/L   Potassium 4.3 3.5 - 5.1 mmol/L   Chloride 107 98 - 111 mmol/L   CO2 24 22 - 32 mmol/L   Glucose, Bld 103 (H) 70 - 99 mg/dL   BUN 6 6  - 20 mg/dL   Creatinine, Ser 0.50 0.44 - 1.00 mg/dL   Calcium 8.8 (L) 8.9 - 10.3 mg/dL   Total Protein 6.3 (L) 6.5 - 8.1 g/dL   Albumin 3.2 (L) 3.5 - 5.0 g/dL   AST 12 (L) 15 - 41 U/L   ALT 13 0 - 44 U/L   Alkaline Phosphatase 68 38 - 126 U/L   Total Bilirubin 0.3 0.3 - 1.2 mg/dL   GFR calc non Af Amer >60 >60 mL/min   GFR calc Af Amer >60 >60 mL/min   Anion gap 8 5 - 15    Comment: Performed at [facility], [place] [address] [place], [place] [address]  CBC     Status: Abnormal   Collection Time: 01/24/19  4:46 AM  Result Value Ref Range   WBC 14.3 (H) 4.0 - 10.5 K/uL   RBC 4.30 3.87 - 5.11 MIL/uL   Hemoglobin 12.6 12.0 - 15.0 g/dL   HCT 38.9 36.0 - 46.0 %   MCV 90.5 80.0 - 100.0 fL   MCH 29.3 26.0 - 34.0 pg   MCHC 32.4 30.0 - 36.0 g/dL  RDW 14.9 11.5 - 15.5 %   Platelets 321 150 - 400 K/uL   nRBC 0.0 0.0 - 0.2 %    Comment: Performed at [facility], [address] [place], [place] [id number]   Ct Abdomen Pelvis W Contrast  Result Date: 01/23/2019 CLINICAL DATA:  Abdominal pain with appendicitis suspected. EXAM: CT ABDOMEN AND PELVIS WITH CONTRAST TECHNIQUE: Multidetector CT imaging of the abdomen and pelvis was performed using the standard protocol following bolus administration of intravenous contrast. CONTRAST:  100mL OMNIPAQUE IOHEXOL 300 MG/ML  SOLN COMPARISON:  None. FINDINGS: Lower chest: The lung bases are clear. The heart size is normal. Hepatobiliary: The liver is normal. Normal gallbladder.There is no biliary ductal dilation. Pancreas: Normal contours without ductal dilatation. No peripancreatic fluid collection. Spleen: No splenic laceration or hematoma. Adrenals/Urinary Tract: --Adrenal glands: No adrenal hemorrhage. --Right kidney/ureter: No hydronephrosis or perinephric hematoma. --Left kidney/ureter: No hydronephrosis or perinephric hematoma. --Urinary bladder: Unremarkable. Stomach/Bowel: --Stomach/Duodenum: No hiatal hernia or  other gastric abnormality. Normal duodenal course and caliber. --Small bowel: No dilatation or inflammation. --Colon: No focal abnormality. --Appendix: There are significant periappendiceal inflammatory changes. The appendix is dilated measuring up to approximately 2.3 cm. Multiple appendicoliths are noted within the distal appendix. There is some free fluid in the right lower quadrant without evidence for well-formed drainable collection. There is no free air. Vascular/Lymphatic: Atherosclerotic calcification is present within the non-aneurysmal abdominal aorta, without hemodynamically significant stenosis. --No retroperitoneal lymphadenopathy. --there are multiple enlarged lymph nodes in the right lower quadrant, presumably reactive in etiology. --No pelvic or inguinal lymphadenopathy. Reproductive: Unremarkable Other: There is a small amount of free fluid in the patient's pelvis, presumably reactive the abdominal wall is normal. Musculoskeletal. No acute displaced fractures. IMPRESSION: 1. Findings are consistent with acute uncomplicated appendicitis. 2. Multiple enlarged right lower quadrant lymph nodes, presumably reactive in etiology. 3. Atherosclerotic changes of the abdominal aorta, greater than expected for a patient of this age. Aortic Atherosclerosis (ICD10-I70.0). Electronically Signed   By: Katherine Mantlehristopher  Green M.D.   On: 01/23/2019 19:24      Assessment/Plan Active Problems:   Appendicitis   Acute appendicitis  Appendicitis OR today for laparoscopic appendectomy Possible discharge from PACU COVID pending  FEN: N.p.o., IV fluids VTE: SCD's, lovenox on hold 2/2 surgery ID: Rocephin and Flagyl Foley: None Follow up: [facility] 2 weeks    Jerre SimonJessica L Bronnie Vasseur, [facility] 01/24/2019, 8:34 AM Please see amion for pager for the following: M, T, W, & Friday 7:00am - 4:30pm Thursdays 7:00am -11:30am

## 2019-01-24 NOTE — Anesthesia Procedure Notes (Signed)
Procedure Name: Intubation Date/Time: 01/24/2019 12:00 PM Performed by: Sharlette Dense, CRNA Patient Re-evaluated:Patient Re-evaluated prior to induction Oxygen Delivery Method: Circle system utilized Preoxygenation: Pre-oxygenation with 100% oxygen Induction Type: IV induction and Cricoid Pressure applied Ventilation: Mask ventilation without difficulty and Oral airway inserted - appropriate to patient size Laryngoscope Size: Sabra Heck and 2 Grade View: Grade I Tube type: Oral Tube size: 7.5 mm Number of attempts: 1 Airway Equipment and Method: Stylet Placement Confirmation: ETT inserted through vocal cords under direct vision,  positive ETCO2 and breath sounds checked- equal and bilateral Secured at: 22 cm Tube secured with: Tape Dental Injury: Teeth and Oropharynx as per pre-operative assessment

## 2019-01-24 NOTE — Discharge Instructions (Signed)

## 2019-01-24 NOTE — Anesthesia Postprocedure Evaluation (Signed)
Anesthesia Post Note  Patient: HORTENSIA DUFFIN  Procedure(s) Performed: OPEN ileocecectomy (N/A Abdomen)     Patient location during evaluation: PACU Anesthesia Type: General Level of consciousness: awake and alert Pain management: pain level controlled Vital Signs Assessment: post-procedure vital signs reviewed and stable Respiratory status: spontaneous breathing, nonlabored ventilation, respiratory function stable and patient connected to nasal cannula oxygen Cardiovascular status: blood pressure returned to baseline and stable Postop Assessment: no apparent nausea or vomiting Anesthetic complications: no    Last Vitals:  Vitals:   01/24/19 1531 01/24/19 1631  BP: (!) 103/55 (!) 103/56  Pulse: 65 66  Resp:  18  Temp: 37.1 C 36.6 C  SpO2: 97% 100%    Last Pain:  Vitals:   01/24/19 1631  TempSrc: Oral  PainSc:                  Nailah Luepke

## 2019-01-24 NOTE — Op Note (Signed)
01/23/2019 - 01/24/2019  2:10 PM  PATIENT:  Ruth Hubbard  38 y.o. female  PRE-OPERATIVE DIAGNOSIS:  appendicitis  POST-OPERATIVE DIAGNOSIS:  appendicitis  PROCEDURE:  Procedure(s): ATTEMPTED LAPAROSCOPIC APPENDECTOMY, OPEN ILEOCECECTOMY  SURGEON:  Surgeon(s) and Role:    * Griselda Miner, MD - Primary  PHYSICIAN ASSISTANT:   ASSISTANTS: Ledora Bottcher, PA   ANESTHESIA:   local and general  EBL:  50 mL   BLOOD ADMINISTERED:none  DRAINS: none   LOCAL MEDICATIONS USED:  MARCAINE     SPECIMEN:  Source of Specimen:  terminal ileum and cecum with perforated appendix  DISPOSITION OF SPECIMEN:  PATHOLOGY  COUNTS:  YES  TOURNIQUET:  * No tourniquets in log *  DICTATION: .Dragon Dictation   After informed consent was obtained the patient was brought to the operating room and placed in the supine position on the operating table.  After adequate induction of general anesthesia the patient's abdomen was prepped with ChloraPrep, allowed to dry, and draped in usual sterile manner.  An appropriate timeout was performed.  The area below the umbilicus was infiltrated with quarter percent Marcaine.  A small incision was made with a 15 blade knife.  The incision was carried through the subcutaneous tissue bluntly with a hemostat until the linea alba is identified.  The linea alba was incised with the 15 blade knife.  Each side was grasped with Coker clamps and elevated anteriorly.  The preperitoneal space was probed bluntly with a hemostat until the peritoneum was opened and access was gained to the abdominal cavity.  A 0 Vicryl pursestring stitch was placed in the fascia surrounding the opening.  The Hassan cannula was placed through the opening and anchored in place with the pursestring stitch.  The abdomen was insufflated with carbon dioxide without difficulty.  Next a suprapubic 5 mm port was placed under direct vision as well as a left upper quadrant 5 mm port.  The camera was moved to the  suprapubic port.  A Glassman grasper and harmonic scalpel were used to examine the right lower quadrant.  The appendix and cecum were very densely stuck to the pelvic sidewall.  As we peeled this area back we encountered a walled off abscess cavity.  The appendix appeared to be perforated where it joined the cecum and the entire area was very woody hard with chronic inflammation.  Because of the amount of inflammation and because the perforation was at the cecal wall the patient needed a ileocecectomy.  At this point the laparoscopy was aborted after mobilizing the right colon by incising its retroperitoneal attachment along the white line of Toldt with the harmonic scalpel.  A midline incision was made around the umbilicus with a 15 blade knife.  The incision was carried through the skin and subcutaneous tissue sharply with the electrocautery.  The Hassan cannula was removed and the rest of the incision was opened under direct vision.  A wound protector was deployed.  The rest of the right colon and hepatic flexure were mobilized by blunt right angle dissection and some sharp dissection with the harmonic scalpel.  Once the right colon was very mobile I then chose a site on the cecum just above the area of inflammation for division of the cecal wall.  The mesentery at this point was opened sharply with the electrocautery.  A GIA-75 stapler was placed across the cecum at this point, clamped, and fired thereby dividing the cecum between staple lines.  A similar area was chosen  in the terminal ileum and the mesentery was opened sharply with the electrocautery.  A GIA-75 stapler was placed across the terminal ileum, clamped, and fired thereby dividing the small bowel between staple lines.  The mesentery to the cecum was then taken down sharply with the harmonic scalpel.  The main vessel was clamped with a Kelly clamp and controlled with a 2-0 silk suture ligature.  The ileocecal ectomy specimen was then removed and sent  to pathology for further evaluation.  The small bowel and right colon approximated each other very easily with no tension.  A small opening was made on the antimesenteric side of the right colon and terminal ileum.  Each limb of a GIA-75 stapler was then placed down the appropriate limb of colon or small bowel.  The GI a stapler was clamped and fired thereby creating a nice widely patent enteroenterostomy.  The common opening was then closed with a single firing of a TA 60 stapler.  The staple line was then imbricated with several 2-0 silk Lembert stitches.  A 2-0 silk crotch stitch was placed.  The mesenteric defect was closed with 2-0 silk figure-of-eight stitches.  The anastomosis was widely patent and the bowel appeared to be very healthy with no tension.  The anastomosis was allowed to drop back into the right lower quadrant.  The abdomen is then irrigated with copious amounts of saline.  At this point all gowns and gloves were changed as well as new drapes were applied.  The fascia of the anterior abdominal wall was then closed with 2 running #1 double-stranded looped PDS sutures.  The subcutaneous tissue was irrigated with copious amounts of saline and the skin was closed with staples.  Sterile dressings were applied.  The patient tolerated the procedure well.  At the end of the case all needle sponge and instrument counts were correct.  The patient was then awakened and taken to recovery in stable condition.  PLAN OF CARE: Admit to inpatient   PATIENT DISPOSITION:  PACU - hemodynamically stable.   Delay start of Pharmacological VTE agent (>24hrs) due to surgical blood loss or risk of bleeding: no

## 2019-01-24 NOTE — Transfer of Care (Signed)
Immediate Anesthesia Transfer of Care Note  Patient: Ruth Hubbard  Procedure(s) Performed: OPEN ileocecectomy (N/A Abdomen)  Patient Location: PACU  Anesthesia Type:General  Level of Consciousness: awake  Airway & Oxygen Therapy: Patient Spontanous Breathing and Patient connected to face mask oxygen  Post-op Assessment: Report given to RN and Post -op Vital signs reviewed and stable  Post vital signs: Reviewed and stable  Last Vitals:  Vitals Value Taken Time  BP 113/63 01/24/19 1426  Temp    Pulse 66 01/24/19 1427  Resp 22 01/24/19 1427  SpO2 100 % 01/24/19 1427  Vitals shown include unvalidated device data.  Last Pain:  Vitals:   01/24/19 1129  TempSrc: Oral  PainSc:          Complications: No apparent anesthesia complications

## 2019-01-25 MED ORDER — MORPHINE SULFATE (PF) 2 MG/ML IV SOLN
1.0000 mg | INTRAVENOUS | Status: DC | PRN
  Administered 2019-01-25: 4 mg via INTRAVENOUS
  Administered 2019-01-25: 2 mg via INTRAVENOUS
  Administered 2019-01-25 (×2): 4 mg via INTRAVENOUS
  Administered 2019-01-26 – 2019-01-29 (×12): 2 mg via INTRAVENOUS
  Filled 2019-01-25 (×2): qty 1
  Filled 2019-01-25: qty 2
  Filled 2019-01-25 (×4): qty 1
  Filled 2019-01-25: qty 2
  Filled 2019-01-25: qty 1
  Filled 2019-01-25: qty 2
  Filled 2019-01-25 (×7): qty 1

## 2019-01-25 MED ORDER — INFLUENZA VAC SPLIT QUAD 0.5 ML IM SUSY
0.5000 mL | PREFILLED_SYRINGE | INTRAMUSCULAR | Status: DC
  Filled 2019-01-25: qty 0.5

## 2019-01-25 MED ORDER — KETOROLAC TROMETHAMINE 30 MG/ML IJ SOLN
30.0000 mg | Freq: Three times a day (TID) | INTRAMUSCULAR | Status: AC
  Administered 2019-01-25 (×3): 30 mg via INTRAVENOUS
  Filled 2019-01-25 (×3): qty 1

## 2019-01-25 NOTE — Progress Notes (Signed)
Patient ID: Ruth Hubbard, female   DOB: Sep 30, 1980, 38 y.o.   MRN: [id number]  1 Day Post-Op  Subjective: Complains of cramping spasm-like pain of the abdominal wall  Objective: Vital signs in last 24 hours: Temp:  [97.8 F (36.6 C)-99.8 F (37.7 C)] 99.8 F (37.7 C) (12/10 0433) Pulse Rate:  [62-76] 76 (12/10 0433) Resp:  [14-20] 20 (12/10 0433) BP: (97-114)/(55-66) 103/57 (12/10 0433) SpO2:  [94 %-100 %] 95 % (12/10 0433) Last BM Date: 01/24/19  Intake/Output from previous day: 12/09 0701 - 12/10 0700 In: 3038.3 [I.V.:2438.3; IV Piggyback:600] Out: 3557 [DUKGU:5427; Blood:50] Intake/Output this shift: No intake/output data recorded.  General appearance: alert and cooperative Resp: clear to auscultation bilaterally Cardio: regular rate and rhythm GI: Soft, appropriately tender, incisions look good.  Quiet.  Lab Results:  Recent Labs    01/23/19 1808 01/24/19 0446  WBC 14.0* 14.3*  HGB 13.7 12.6  HCT 41.0 38.9  PLT 323 321   BMET Recent Labs    01/23/19 1808 01/24/19 0446  NA 138 139  K 3.8 4.3  CL 106 107  CO2 25 24  GLUCOSE 87 103*  BUN 8 6  CREATININE 0.56 0.50  CALCIUM 9.1 8.8*   PT/INR No results for input(s): LABPROT, INR in the last 72 hours. ABG No results for input(s): PHART, HCO3 in the last 72 hours.  Invalid input(s): PCO2, PO2  Studies/Results: CT Abdomen Pelvis W Contrast  Result Date: 01/23/2019 CLINICAL DATA:  Abdominal pain with appendicitis suspected. EXAM: CT ABDOMEN AND PELVIS WITH CONTRAST TECHNIQUE: Multidetector CT imaging of the abdomen and pelvis was performed using the standard protocol following bolus administration of intravenous contrast. CONTRAST:  152mL OMNIPAQUE IOHEXOL 300 MG/ML  SOLN COMPARISON:  None. FINDINGS: Lower chest: The lung bases are clear. The heart size is normal. Hepatobiliary: The liver is normal. Normal gallbladder.There is no biliary ductal dilation. Pancreas: Normal contours without ductal dilatation. No  peripancreatic fluid collection. Spleen: No splenic laceration or hematoma. Adrenals/Urinary Tract: --Adrenal glands: No adrenal hemorrhage. --Right kidney/ureter: No hydronephrosis or perinephric hematoma. --Left kidney/ureter: No hydronephrosis or perinephric hematoma. --Urinary bladder: Unremarkable. Stomach/Bowel: --Stomach/Duodenum: No hiatal hernia or other gastric abnormality. Normal duodenal course and caliber. --Small bowel: No dilatation or inflammation. --Colon: No focal abnormality. --Appendix: There are significant periappendiceal inflammatory changes. The appendix is dilated measuring up to approximately 2.3 cm. Multiple appendicoliths are noted within the distal appendix. There is some free fluid in the right lower quadrant without evidence for well-formed drainable collection. There is no free air. Vascular/Lymphatic: Atherosclerotic calcification is present within the non-aneurysmal abdominal aorta, without hemodynamically significant stenosis. --No retroperitoneal lymphadenopathy. --there are multiple enlarged lymph nodes in the right lower quadrant, presumably reactive in etiology. --No pelvic or inguinal lymphadenopathy. Reproductive: Unremarkable Other: There is a small amount of free fluid in the patient's pelvis, presumably reactive the abdominal wall is normal. Musculoskeletal. No acute displaced fractures. IMPRESSION: 1. Findings are consistent with acute uncomplicated appendicitis. 2. Multiple enlarged right lower quadrant lymph nodes, presumably reactive in etiology. 3. Atherosclerotic changes of the abdominal aorta, greater than expected for a patient of this age. Aortic Atherosclerosis (ICD10-I70.0). Electronically Signed   By: Constance Holster M.D.   On: 01/23/2019 19:24    Anti-infectives: Anti-infectives (From admission, onward)   Start     Dose/Rate Route Frequency Ordered Stop   01/24/19 2200  cefTRIAXone (ROCEPHIN) 2 g in sodium chloride 0.9 % 100 mL IVPB     2 g 200  mL/hr over  30 Minutes Intravenous Every 24 hours 01/24/19 0213     01/24/19 0600  metroNIDAZOLE (FLAGYL) IVPB 500 mg     500 mg 100 mL/hr over 60 Minutes Intravenous Every 8 hours 01/24/19 0213     01/23/19 2130  cefTRIAXone (ROCEPHIN) 1 g in sodium chloride 0.9 % 100 mL IVPB     1 g 200 mL/hr over 30 Minutes Intravenous  Once 01/23/19 2118 01/23/19 2216   01/23/19 2130  metroNIDAZOLE (FLAGYL) IVPB 500 mg     500 mg 100 mL/hr over 60 Minutes Intravenous  Once 01/23/19 2118 01/23/19 2333      Assessment/Plan: s/p Procedure(s): OPEN ileocecectomy Continue bowel rest and ice chips  Will add Toradol to pain medicine Ambulate Postop day 1  LOS: 1 day    Chevis Pretty III 01/25/2019

## 2019-01-25 NOTE — Progress Notes (Signed)
MD paged regarding patients request for abd binder. Order received.

## 2019-01-26 LAB — NOVEL CORONAVIRUS, NAA: SARS-CoV-2, NAA: NOT DETECTED

## 2019-01-26 LAB — BASIC METABOLIC PANEL
Anion gap: 10 (ref 5–15)
BUN: 5 mg/dL — ABNORMAL LOW (ref 6–20)
CO2: 23 mmol/L (ref 22–32)
Calcium: 8.6 mg/dL — ABNORMAL LOW (ref 8.9–10.3)
Chloride: 106 mmol/L (ref 98–111)
Creatinine, Ser: 0.57 mg/dL (ref 0.44–1.00)
GFR calc Af Amer: >60 mL/min (ref >60)
GFR calc non Af Amer: >60 mL/min (ref >60)
Glucose, Bld: 108 mg/dL — ABNORMAL HIGH (ref 70–99)
Potassium: 4.2 mmol/L (ref 3.5–5.1)
Sodium: 139 mmol/L (ref 135–145)

## 2019-01-26 LAB — CBC
HCT: 36.6 % (ref 36.0–46.0)
Hemoglobin: 11.8 g/dL — ABNORMAL LOW (ref 12.0–15.0)
MCH: 29.6 pg (ref 26.0–34.0)
MCHC: 32.2 g/dL (ref 30.0–36.0)
MCV: 91.7 fL (ref 80.0–100.0)
Platelets: 294 10*3/uL (ref 150–400)
RBC: 3.99 MIL/uL (ref 3.87–5.11)
RDW: 15.2 % (ref 11.5–15.5)
WBC: 19.2 10*3/uL — ABNORMAL HIGH (ref 4.0–10.5)
nRBC: 0 % (ref 0.0–0.2)

## 2019-01-26 LAB — SURGICAL PATHOLOGY

## 2019-01-26 MED ORDER — KETOROLAC TROMETHAMINE 30 MG/ML IJ SOLN
30.0000 mg | Freq: Three times a day (TID) | INTRAMUSCULAR | Status: AC | PRN
  Administered 2019-01-26 – 2019-01-27 (×3): 30 mg via INTRAVENOUS
  Filled 2019-01-26 (×3): qty 1

## 2019-01-26 MED ORDER — METHOCARBAMOL 1000 MG/10ML IJ SOLN
500.0000 mg | Freq: Four times a day (QID) | INTRAVENOUS | Status: DC | PRN
  Administered 2019-01-26 – 2019-01-30 (×5): 500 mg via INTRAVENOUS
  Filled 2019-01-26 (×5): qty 500

## 2019-01-26 MED ORDER — ACETAMINOPHEN 500 MG PO TABS
1000.0000 mg | ORAL_TABLET | Freq: Four times a day (QID) | ORAL | Status: DC
  Administered 2019-01-26 – 2019-02-01 (×17): 1000 mg via ORAL
  Filled 2019-01-26 (×20): qty 2

## 2019-01-26 MED ORDER — LORAZEPAM 2 MG/ML IJ SOLN
0.5000 mg | Freq: Four times a day (QID) | INTRAMUSCULAR | Status: DC | PRN
  Administered 2019-01-27 – 2019-02-06 (×17): 0.5 mg via INTRAVENOUS
  Filled 2019-01-26 (×17): qty 1

## 2019-01-26 MED ORDER — PROMETHAZINE HCL 25 MG/ML IJ SOLN
12.5000 mg | Freq: Four times a day (QID) | INTRAMUSCULAR | Status: DC | PRN
  Administered 2019-01-27 – 2019-01-28 (×2): 25 mg via INTRAVENOUS
  Filled 2019-01-26 (×2): qty 1

## 2019-01-26 MED ORDER — SIMETHICONE 80 MG PO CHEW
80.0000 mg | CHEWABLE_TABLET | Freq: Four times a day (QID) | ORAL | Status: DC | PRN
  Administered 2019-01-26 – 2019-02-05 (×6): 80 mg via ORAL
  Filled 2019-01-26 (×6): qty 1

## 2019-01-26 NOTE — Progress Notes (Signed)
  2 Days Post-Op  Subjective: Pain seems a little better. Still no flatus  Objective: Vital signs in last 24 hours: Temp:  [98.2 F (36.8 C)-99.1 F (37.3 C)] 99.1 F (37.3 C) (12/11 0559) Pulse Rate:  [57-73] 73 (12/11 0559) Resp:  [17-18] 18 (12/11 0559) BP: (107-115)/(63-69) 112/63 (12/11 0559) SpO2:  [94 %-98 %] 95 % (12/11 0559) Last BM Date: 01/23/19  Intake/Output from previous day: 12/10 0701 - 12/11 0700 In: 888.7 [P.O.:480; I.V.:57; IV Piggyback:351.7] Out: 2900 [Urine:2900] Intake/Output this shift: Total I/O In: -  Out: 200 [Urine:200]  General appearance: alert and cooperative Resp: clear to auscultation bilaterally Cardio: regular rate and rhythm GI: soft, moderate tenderness. incision looks good  Lab Results:  Recent Labs    01/23/19 1808 01/24/19 0446  WBC 14.0* 14.3*  HGB 13.7 12.6  HCT 41.0 38.9  PLT 323 321   BMET Recent Labs    01/23/19 1808 01/24/19 0446  NA 138 139  K 3.8 4.3  CL 106 107  CO2 25 24  GLUCOSE 87 103*  BUN 8 6  CREATININE 0.56 0.50  CALCIUM 9.1 8.8*   PT/INR No results for input(s): LABPROT, INR in the last 72 hours. ABG No results for input(s): PHART, HCO3 in the last 72 hours.  Invalid input(s): PCO2, PO2  Studies/Results: No results found.  Anti-infectives: Anti-infectives (From admission, onward)   Start     Dose/Rate Route Frequency Ordered Stop   01/24/19 2200  cefTRIAXone (ROCEPHIN) 2 g in sodium chloride 0.9 % 100 mL IVPB     2 g 200 mL/hr over 30 Minutes Intravenous Every 24 hours 01/24/19 0213     01/24/19 0600  metroNIDAZOLE (FLAGYL) IVPB 500 mg     500 mg 100 mL/hr over 60 Minutes Intravenous Every 8 hours 01/24/19 0213     01/23/19 2130  cefTRIAXone (ROCEPHIN) 1 g in sodium chloride 0.9 % 100 mL IVPB     1 g 200 mL/hr over 30 Minutes Intravenous  Once 01/23/19 2118 01/23/19 2216   01/23/19 2130  metroNIDAZOLE (FLAGYL) IVPB 500 mg     500 mg 100 mL/hr over 60 Minutes Intravenous  Once  01/23/19 2118 01/23/19 2333      Assessment/Plan: s/p Procedure(s): OPEN ileocecectomy POD 2  Postop ileus. Continue bowel rest Add toradol for pain control Ambulate Check wbc and lytes today Continue rocephin and flagyl  LOS: 3 days    Autumn Messing III 01/26/2019

## 2019-01-26 NOTE — Plan of Care (Signed)

## 2019-01-26 NOTE — Progress Notes (Signed)
Notified [employer] of patient becoming anxious, hot flashes, having severe pain and cramping when attempting to sit on the side of the bed. Vitals taken and wnl. Patient has hypoactive bowel sounds and is distended more than this morning. Reassured patient when upset/anxious. Encouraged patient to ambulated with assistance, but is refusing to do so each time. Will continue to monitor. Call bell within reach.

## 2019-01-27 LAB — CBC
HCT: 35.1 % — ABNORMAL LOW (ref 36.0–46.0)
Hemoglobin: 11.3 g/dL — ABNORMAL LOW (ref 12.0–15.0)
MCH: 29.3 pg (ref 26.0–34.0)
MCHC: 32.2 g/dL (ref 30.0–36.0)
MCV: 90.9 fL (ref 80.0–100.0)
Platelets: 304 10*3/uL (ref 150–400)
RBC: 3.86 MIL/uL — ABNORMAL LOW (ref 3.87–5.11)
RDW: 15 % (ref 11.5–15.5)
WBC: 21.5 10*3/uL — ABNORMAL HIGH (ref 4.0–10.5)
nRBC: 0 % (ref 0.0–0.2)

## 2019-01-27 MED ORDER — ENSURE ENLIVE PO LIQD
237.0000 mL | Freq: Two times a day (BID) | ORAL | Status: DC
  Administered 2019-01-27: 237 mL via ORAL

## 2019-01-27 NOTE — Progress Notes (Signed)
Progress Note: General Surgery Service   Chief Complaint/Subjective: Hot flashes and anxiety yesterday, +BM, some feelings of fullness/bloating  Objective: Vital signs in last 24 hours: Temp:  [97.9 F (36.6 C)-100.4 F (38 C)] 98.4 F (36.9 C) (12/12 0528) Pulse Rate:  [65-88] 65 (12/12 0528) Resp:  [16-20] 20 (12/12 0528) BP: (116-132)/(57-88) 116/57 (12/12 0528) SpO2:  [93 %-100 %] 97 % (12/12 0528) Last BM Date: 01/23/19  Intake/Output from previous day: 12/11 0701 - 12/12 0700 In: 3010 [P.O.:210; I.V.:2250; IV Piggyback:550] Out: 2075 [Urine:2075] Intake/Output this shift: No intake/output data recorded.  Gen: NAD  Resp: nonlabored  Card: RRR  Abd: soft, incisions c/d/i, slight distension  Lab Results: CBC  Recent Labs    01/26/19 0855 01/27/19 0442  WBC 19.2* 21.5*  HGB 11.8* 11.3*  HCT 36.6 35.1*  PLT 294 304   BMET Recent Labs    01/26/19 0855  NA 139  K 4.2  CL 106  CO2 23  GLUCOSE 108*  BUN 5*  CREATININE 0.57  CALCIUM 8.6*   PT/INR No results for input(s): LABPROT, INR in the last 72 hours. ABG No results for input(s): PHART, HCO3 in the last 72 hours.  Invalid input(s): PCO2, PO2  Anti-infectives: Anti-infectives (From admission, onward)   Start     Dose/Rate Route Frequency Ordered Stop   01/24/19 2200  cefTRIAXone (ROCEPHIN) 2 g in sodium chloride 0.9 % 100 mL IVPB     2 g 200 mL/hr over 30 Minutes Intravenous Every 24 hours 01/24/19 0213     01/24/19 0600  metroNIDAZOLE (FLAGYL) IVPB 500 mg     500 mg 100 mL/hr over 60 Minutes Intravenous Every 8 hours 01/24/19 0213     01/23/19 2130  cefTRIAXone (ROCEPHIN) 1 g in sodium chloride 0.9 % 100 mL IVPB     1 g 200 mL/hr over 30 Minutes Intravenous  Once 01/23/19 2118 01/23/19 2216   01/23/19 2130  metroNIDAZOLE (FLAGYL) IVPB 500 mg     500 mg 100 mL/hr over 60 Minutes Intravenous  Once 01/23/19 2118 01/23/19 2333      Medications: Scheduled Meds: . acetaminophen  1,000 mg  Oral Q6H  . Chlorhexidine Gluconate Cloth  6 each Topical Daily  . feeding supplement (ENSURE ENLIVE)  237 mL Oral BID BM  . influenza vac split quadrivalent PF  0.5 mL Intramuscular Tomorrow-1000  . mupirocin ointment  1 application Nasal BID   Continuous Infusions: . cefTRIAXone (ROCEPHIN)  IV 2 g (01/26/19 2216)   And  . metronidazole 500 mg (01/27/19 0826)  . dextrose 5 % and 0.45 % NaCl with KCl 20 mEq/L 125 mL/hr at 01/27/19 0200  . methocarbamol (ROBAXIN) IV 500 mg (01/27/19 0033)   PRN Meds:.ketorolac, lip balm, LORazepam, methocarbamol (ROBAXIN) IV, metoprolol tartrate, morphine injection, ondansetron **OR** ondansetron (ZOFRAN) IV, oxyCODONE, promethazine, simethicone  Assessment/Plan: s/p Procedure(s): OPEN ileocecectomy 01/24/2019 -trial liquids today -continue multimodal pain control -daily CBC -continue fluids -continue abx    LOS: 4 days   Mickeal Skinner, MD [facility]

## 2019-01-28 ENCOUNTER — Inpatient Hospital Stay (HOSPITAL_COMMUNITY): Payer: BC Managed Care – PPO

## 2019-01-28 LAB — CBC
HCT: 36.3 % (ref 36.0–46.0)
Hemoglobin: 12 g/dL (ref 12.0–15.0)
MCH: 29.8 pg (ref 26.0–34.0)
MCHC: 33.1 g/dL (ref 30.0–36.0)
MCV: 90.1 fL (ref 80.0–100.0)
Platelets: 350 10*3/uL (ref 150–400)
RBC: 4.03 MIL/uL (ref 3.87–5.11)
RDW: 15 % (ref 11.5–15.5)
WBC: 20 10*3/uL — ABNORMAL HIGH (ref 4.0–10.5)
nRBC: 0 % (ref 0.0–0.2)

## 2019-01-28 NOTE — Progress Notes (Signed)
Progress Note: General Surgery Service   Chief Complaint/Subjective: Nauseated, 1 vomiting yesterday, 2 today, small bowel movements, uncomfortable  Objective: Vital signs in last 24 hours: Temp:  [99 F (37.2 C)-99.4 F (37.4 C)] 99.1 F (37.3 C) (12/13 0500) Pulse Rate:  [67-71] 70 (12/13 0500) Resp:  [16-18] 16 (12/13 0500) BP: (122-137)/(67-75) 135/69 (12/13 0500) SpO2:  [93 %-99 %] 97 % (12/13 0500) Last BM Date: 01/23/19  Intake/Output from previous day: 12/12 0701 - 12/13 0700 In: 2053.5 [P.O.:120; I.V.:1391.5; IV Piggyback:542] Out: 450 [Urine:450] Intake/Output this shift: Total I/O In: -  Out: 125 [Urine:125]  Gen: NAd  Resp: nonlabored  Card: RRR  Abd: soft, distended, incisions c/d/i  Lab Results: CBC  Recent Labs    01/27/19 0442 01/28/19 0448  WBC 21.5* 20.0*  HGB 11.3* 12.0  HCT 35.1* 36.3  PLT 304 350   BMET Recent Labs    01/26/19 0855  NA 139  K 4.2  CL 106  CO2 23  GLUCOSE 108*  BUN 5*  CREATININE 0.57  CALCIUM 8.6*   PT/INR No results for input(s): LABPROT, INR in the last 72 hours. ABG No results for input(s): PHART, HCO3 in the last 72 hours.  Invalid input(s): PCO2, PO2  Anti-infectives: Anti-infectives (From admission, onward)   Start     Dose/Rate Route Frequency Ordered Stop   01/24/19 2200  cefTRIAXone (ROCEPHIN) 2 g in sodium chloride 0.9 % 100 mL IVPB     2 g 200 mL/hr over 30 Minutes Intravenous Every 24 hours 01/24/19 0213     01/24/19 0600  metroNIDAZOLE (FLAGYL) IVPB 500 mg     500 mg 100 mL/hr over 60 Minutes Intravenous Every 8 hours 01/24/19 0213     01/23/19 2130  cefTRIAXone (ROCEPHIN) 1 g in sodium chloride 0.9 % 100 mL IVPB     1 g 200 mL/hr over 30 Minutes Intravenous  Once 01/23/19 2118 01/23/19 2216   01/23/19 2130  metroNIDAZOLE (FLAGYL) IVPB 500 mg     500 mg 100 mL/hr over 60 Minutes Intravenous  Once 01/23/19 2118 01/23/19 2333      Medications: Scheduled Meds: . acetaminophen  1,000 mg  Oral Q6H  . Chlorhexidine Gluconate Cloth  6 each Topical Daily  . feeding supplement (ENSURE ENLIVE)  237 mL Oral BID BM  . influenza vac split quadrivalent PF  0.5 mL Intramuscular Tomorrow-1000  . mupirocin ointment  1 application Nasal BID   Continuous Infusions: . cefTRIAXone (ROCEPHIN)  IV 2 g (01/27/19 2052)   And  . metronidazole 500 mg (01/28/19 0508)  . dextrose 5 % and 0.45 % NaCl with KCl 20 mEq/L 125 mL/hr at 01/28/19 0027  . methocarbamol (ROBAXIN) IV 500 mg (01/27/19 1158)   PRN Meds:.lip balm, LORazepam, methocarbamol (ROBAXIN) IV, metoprolol tartrate, morphine injection, ondansetron **OR** ondansetron (ZOFRAN) IV, oxyCODONE, promethazine, simethicone  Assessment/Plan: s/p Procedure(s): OPEN ileocecectomy 01/24/2019 -XR abd to eval for possible ileus -possible NG tube insertion -continue fluids -WBC stable, continue monitoring -continue abx   LOS: 5 days   Mickeal Skinner, MD [facility]

## 2019-01-28 NOTE — Plan of Care (Signed)
  Problem: Education: Goal: Knowledge of General Education information will improve Description: Including pain rating scale, medication(s)/side effects and non-pharmacologic comfort measures Outcome: Progressing   Problem: Clinical Measurements: Goal: Will remain free from infection Outcome: Progressing Goal: Respiratory complications will improve Outcome: Progressing   Problem: Activity: Goal: Risk for activity intolerance will decrease Outcome: Progressing   Problem: Nutrition: Goal: Adequate nutrition will be maintained Outcome: Progressing   Problem: Pain Managment: Goal: General experience of comfort will improve Outcome: Progressing   

## 2019-01-29 LAB — CBC
HCT: 36.8 % (ref 36.0–46.0)
Hemoglobin: 12.1 g/dL (ref 12.0–15.0)
MCH: 29.7 pg (ref 26.0–34.0)
MCHC: 32.9 g/dL (ref 30.0–36.0)
MCV: 90.2 fL (ref 80.0–100.0)
Platelets: 378 10*3/uL (ref 150–400)
RBC: 4.08 MIL/uL (ref 3.87–5.11)
RDW: 15.2 % (ref 11.5–15.5)
WBC: 17.4 10*3/uL — ABNORMAL HIGH (ref 4.0–10.5)
nRBC: 0 % (ref 0.0–0.2)

## 2019-01-29 MED ORDER — ENOXAPARIN SODIUM 40 MG/0.4ML ~~LOC~~ SOLN
40.0000 mg | SUBCUTANEOUS | Status: DC
  Administered 2019-01-29 – 2019-02-05 (×8): 40 mg via SUBCUTANEOUS
  Filled 2019-01-29 (×8): qty 0.4

## 2019-01-29 NOTE — Progress Notes (Signed)
Patient ID: KARLEEN SEEBECK, female   DOB: [birth year], 38 y.o.   MRN: [id number]    5 Days Post-Op  Subjective: Patient with pain this morning secondary to her abdominal distention.  No further vomiting.  After I left, RN informed me that she had a large BM shortly after me leaving.  Patient didn't walk yesterday.  Concerned about walking in the halls secondary to [place].  ROS: See above, otherwise other systems negative  Objective: Vital signs in last 24 hours: Temp:  [98.3 F (36.8 C)-99.4 F (37.4 C)] 98.3 F (36.8 C) (12/14 0529) Pulse Rate:  [59-66] 61 (12/14 0529) Resp:  [16-18] 18 (12/14 0529) BP: (126-146)/(72-75) 146/75 (12/14 0529) SpO2:  [94 %-100 %] 94 % (12/14 0529) Last BM Date: 01/28/19  Intake/Output from previous day: 12/13 0701 - 12/14 0700 In: 2980.8 [P.O.:60; I.V.:2606; IV Piggyback:314.8] Out: 1425 [Urine:1425] Intake/Output this shift: No intake/output data recorded.  PE: Gen: NAD Heart: regular Lungs: CTAB Abd: soft, but slightly tight, with some distention, few BS noted, tender as expected.  Midline incision and lap incisions are all c/d/i with staples present.  Lab Results:  Recent Labs    01/28/19 0448 01/29/19 0439  WBC 20.0* 17.4*  HGB 12.0 12.1  HCT 36.3 36.8  PLT 350 378   BMET No results for input(s): NA, K, CL, CO2, GLUCOSE, BUN, CREATININE, CALCIUM in the last 72 hours. PT/INR No results for input(s): LABPROT, INR in the last 72 hours. CMP     Component Value Date/Time   NA 139 01/26/2019 0855   K 4.2 01/26/2019 0855   CL 106 01/26/2019 0855   CO2 23 01/26/2019 0855   GLUCOSE 108 (H) 01/26/2019 0855   BUN 5 (L) 01/26/2019 0855   CREATININE 0.57 01/26/2019 0855   CALCIUM 8.6 (L) 01/26/2019 0855   PROT 6.3 (L) 01/24/2019 0446   ALBUMIN 3.2 (L) 01/24/2019 0446   AST 12 (L) 01/24/2019 0446   ALT 13 01/24/2019 0446   ALKPHOS 68 01/24/2019 0446   BILITOT 0.3 01/24/2019 0446   GFRNONAA >60 01/26/2019 0855   GFRAA >60 01/26/2019  0855   Lipase     Component Value Date/Time   LIPASE 40 01/23/2019 1808       Studies/Results: DG Abd Portable 1V  Result Date: 01/28/2019 CLINICAL DATA:  Pt c/o abdomen pain and vomiting, Pt s/p OPEN ileocecectomy, Postop ileus EXAM: PORTABLE ABDOMEN - 1 VIEW COMPARISON:  CT abdomen pelvis 01/23/2019 FINDINGS: There are multiple dilated loops of small bowel measuring up to 4.8 cm in the central abdomen small amount of gas distally. No supine evidence for free air. Surgical staple line over the mid abdomen. No acute finding in the visualized skeleton. Lung bases are clear. IMPRESSION: Multiple dilated loops of small bowel in the central abdomen most likely represent ileus in the postoperative setting though small-bowel obstruction has a similar appearance. Electronically Signed   By: Audie Pinto M.D.   On: 01/28/2019 12:44    Anti-infectives: Anti-infectives (From admission, onward)   Start     Dose/Rate Route Frequency Ordered Stop   01/24/19 2200  cefTRIAXone (ROCEPHIN) 2 g in sodium chloride 0.9 % 100 mL IVPB     2 g 200 mL/hr over 30 Minutes Intravenous Every 24 hours 01/24/19 0213     01/24/19 0600  metroNIDAZOLE (FLAGYL) IVPB 500 mg     500 mg 100 mL/hr over 60 Minutes Intravenous Every 8 hours 01/24/19 0213     01/23/19 2130  cefTRIAXone (  ROCEPHIN) 1 g in sodium chloride 0.9 % 100 mL IVPB     1 g 200 mL/hr over 30 Minutes Intravenous  Once 01/23/19 2118 01/23/19 2216   01/23/19 2130  metroNIDAZOLE (FLAGYL) IVPB 500 mg     500 mg 100 mL/hr over 60 Minutes Intravenous  Once 01/23/19 2118 01/23/19 2333       Assessment/Plan POD 5, s/p lap converted to open ileocecectomy for acute perforated appendicitis -expected post op ileus -NPO x ice and few sips.  If she vomiting again will need NGT, but will hold off for now -extensive education on ambulating and the expected process of of the post op course of an ileus and how this works and how this improves. -cont robaxin  for muscle spasms -WBC down to 17K, cont to follow.  If persistent ileus and elevated WBC, will need CT scan to rule out post op abscess given OR findings. -check BMET in am  FEN: IVFs, NPO x ice and sips VTE: Lovenox ID: Rocephin/Flagyl Follow up: Carolynne Edouard   LOS: 6 days    Letha Cape , [facility] 01/29/2019, 11:15 AM Please see Amion for pager number during day hours 7:00am-4:30pm

## 2019-01-30 LAB — CBC
HCT: 35.5 % — ABNORMAL LOW (ref 36.0–46.0)
Hemoglobin: 11.7 g/dL — ABNORMAL LOW (ref 12.0–15.0)
MCH: 29.8 pg (ref 26.0–34.0)
MCHC: 33 g/dL (ref 30.0–36.0)
MCV: 90.3 fL (ref 80.0–100.0)
Platelets: 371 10*3/uL (ref 150–400)
RBC: 3.93 MIL/uL (ref 3.87–5.11)
RDW: 15.1 % (ref 11.5–15.5)
WBC: 15.2 10*3/uL — ABNORMAL HIGH (ref 4.0–10.5)
nRBC: 0 % (ref 0.0–0.2)

## 2019-01-30 LAB — BASIC METABOLIC PANEL
Anion gap: 7 (ref 5–15)
BUN: <5 mg/dL — ABNORMAL LOW (ref 6–20)
CO2: 22 mmol/L (ref 22–32)
Calcium: 8.4 mg/dL — ABNORMAL LOW (ref 8.9–10.3)
Chloride: 108 mmol/L (ref 98–111)
Creatinine, Ser: 0.41 mg/dL — ABNORMAL LOW (ref 0.44–1.00)
GFR calc Af Amer: >60 mL/min (ref >60)
GFR calc non Af Amer: >60 mL/min (ref >60)
Glucose, Bld: 123 mg/dL — ABNORMAL HIGH (ref 70–99)
Potassium: 4 mmol/L (ref 3.5–5.1)
Sodium: 137 mmol/L (ref 135–145)

## 2019-01-30 MED ORDER — PANTOPRAZOLE SODIUM 40 MG IV SOLR
40.0000 mg | INTRAVENOUS | Status: DC
  Administered 2019-01-30 – 2019-02-03 (×5): 40 mg via INTRAVENOUS
  Filled 2019-01-30 (×5): qty 40

## 2019-01-30 MED ORDER — MORPHINE SULFATE (PF) 2 MG/ML IV SOLN
1.0000 mg | INTRAVENOUS | Status: DC | PRN
  Administered 2019-01-30 – 2019-02-01 (×4): 2 mg via INTRAVENOUS
  Administered 2019-02-02: 1 mg via INTRAVENOUS
  Administered 2019-02-03: 2 mg via INTRAVENOUS
  Filled 2019-01-30 (×6): qty 1

## 2019-01-30 MED ORDER — METHOCARBAMOL 500 MG PO TABS
500.0000 mg | ORAL_TABLET | Freq: Four times a day (QID) | ORAL | Status: DC | PRN
  Administered 2019-01-31 (×2): 500 mg via ORAL
  Filled 2019-01-30 (×2): qty 1

## 2019-01-30 MED ORDER — TRAMADOL HCL 50 MG PO TABS
50.0000 mg | ORAL_TABLET | Freq: Four times a day (QID) | ORAL | Status: DC | PRN
  Administered 2019-01-31: 50 mg via ORAL
  Filled 2019-01-30: qty 1

## 2019-01-30 MED ORDER — KETOROLAC TROMETHAMINE 30 MG/ML IJ SOLN
30.0000 mg | Freq: Three times a day (TID) | INTRAMUSCULAR | Status: AC
  Administered 2019-01-30 – 2019-02-01 (×6): 30 mg via INTRAVENOUS
  Filled 2019-01-30 (×6): qty 1

## 2019-01-30 NOTE — Progress Notes (Signed)
Patient ID: Ruth Hubbard, female   DOB: [birth year], 38 y.o.   MRN: [id number]    6 Days Post-Op  Subjective: Patient feels much better today.  Having multiple BMs.  Much less bloating and no further nausea.    ROS: See above, otherwise other systems negative  Objective: Vital signs in last 24 hours: Temp:  [98.1 F (36.7 C)-99.8 F (37.7 C)] 98.1 F (36.7 C) (12/15 0542) Pulse Rate:  [58-64] 58 (12/15 0542) Resp:  [15-18] 15 (12/15 0542) BP: (117-132)/(62-69) 117/62 (12/15 0542) SpO2:  [99 %-100 %] 99 % (12/15 0542) Last BM Date: 01/30/19  Intake/Output from previous day: 12/14 0701 - 12/15 0700 In: 2872.4 [P.O.:60; I.V.:2496.5; IV Piggyback:315.9] Out: 1850 [Urine:1850] Intake/Output this shift: Total I/O In: -  Out: 200 [Urine:200]  PE: Heart: regular Lungs: CTAB Abd: much softer, less distended, +BS, appropriately tender, incisions c/d/i  Lab Results:  Recent Labs    01/29/19 0439 01/30/19 0518  WBC 17.4* 15.2*  HGB 12.1 11.7*  HCT 36.8 35.5*  PLT 378 371   BMET Recent Labs    01/30/19 0518  NA 137  K 4.0  CL 108  CO2 22  GLUCOSE 123*  BUN <5*  CREATININE 0.41*  CALCIUM 8.4*   PT/INR No results for input(s): LABPROT, INR in the last 72 hours. CMP     Component Value Date/Time   NA 137 01/30/2019 0518   K 4.0 01/30/2019 0518   CL 108 01/30/2019 0518   CO2 22 01/30/2019 0518   GLUCOSE 123 (H) 01/30/2019 0518   BUN <5 (L) 01/30/2019 0518   CREATININE 0.41 (L) 01/30/2019 0518   CALCIUM 8.4 (L) 01/30/2019 0518   PROT 6.3 (L) 01/24/2019 0446   ALBUMIN 3.2 (L) 01/24/2019 0446   AST 12 (L) 01/24/2019 0446   ALT 13 01/24/2019 0446   ALKPHOS 68 01/24/2019 0446   BILITOT 0.3 01/24/2019 0446   GFRNONAA >60 01/30/2019 0518   GFRAA >60 01/30/2019 0518   Lipase     Component Value Date/Time   LIPASE 40 01/23/2019 1808       Studies/Results: DG Abd Portable 1V  Result Date: 01/28/2019 CLINICAL DATA:  Pt c/o abdomen pain and vomiting, Pt  s/p OPEN ileocecectomy, Postop ileus EXAM: PORTABLE ABDOMEN - 1 VIEW COMPARISON:  CT abdomen pelvis 01/23/2019 FINDINGS: There are multiple dilated loops of small bowel measuring up to 4.8 cm in the central abdomen small amount of gas distally. No supine evidence for free air. Surgical staple line over the mid abdomen. No acute finding in the visualized skeleton. Lung bases are clear. IMPRESSION: Multiple dilated loops of small bowel in the central abdomen most likely represent ileus in the postoperative setting though small-bowel obstruction has a similar appearance. Electronically Signed   By: Audie Pinto M.D.   On: 01/28/2019 12:44    Anti-infectives: Anti-infectives (From admission, onward)   Start     Dose/Rate Route Frequency Ordered Stop   01/24/19 2200  cefTRIAXone (ROCEPHIN) 2 g in sodium chloride 0.9 % 100 mL IVPB     2 g 200 mL/hr over 30 Minutes Intravenous Every 24 hours 01/24/19 0213     01/24/19 0600  metroNIDAZOLE (FLAGYL) IVPB 500 mg     500 mg 100 mL/hr over 60 Minutes Intravenous Every 8 hours 01/24/19 0213     01/23/19 2130  cefTRIAXone (ROCEPHIN) 1 g in sodium chloride 0.9 % 100 mL IVPB     1 g 200 mL/hr over 30 Minutes Intravenous  Once 01/23/19 2118 01/23/19 2216   01/23/19 2130  metroNIDAZOLE (FLAGYL) IVPB 500 mg     500 mg 100 mL/hr over 60 Minutes Intravenous  Once 01/23/19 2118 01/23/19 2333       Assessment/Plan POD 6, s/p lap converted to open ileocecectomy for acute perforated appendicitis -expected post op ileus, resolving -CLD today -electrolytes good -mobilize -pulm toilet -follow WBC  FEN: SLIV, CLD VTE: Lovenox ID: Rocephin/Flagyl Follow up: Carolynne Edouard   LOS: 7 days    Letha Cape , [facility] 01/30/2019, 8:58 AM Please see Amion for pager number during day hours 7:00am-4:30pm

## 2019-01-31 ENCOUNTER — Inpatient Hospital Stay (HOSPITAL_COMMUNITY): Payer: BC Managed Care – PPO

## 2019-01-31 LAB — CBC
HCT: 42.5 % (ref 36.0–46.0)
Hemoglobin: 14.5 g/dL (ref 12.0–15.0)
MCH: 29.9 pg (ref 26.0–34.0)
MCHC: 34.1 g/dL (ref 30.0–36.0)
MCV: 87.6 fL (ref 80.0–100.0)
Platelets: 229 10*3/uL (ref 150–400)
RBC: 4.85 MIL/uL (ref 3.87–5.11)
RDW: 14.9 % (ref 11.5–15.5)
WBC: 16.7 10*3/uL — ABNORMAL HIGH (ref 4.0–10.5)
nRBC: 0 % (ref 0.0–0.2)

## 2019-01-31 MED ORDER — IOHEXOL 9 MG/ML PO SOLN
500.0000 mL | ORAL | Status: AC
  Administered 2019-01-31: 500 mL via ORAL

## 2019-01-31 MED ORDER — IOHEXOL 9 MG/ML PO SOLN
ORAL | Status: AC
  Filled 2019-01-31: qty 1000

## 2019-01-31 MED ORDER — IOHEXOL 300 MG/ML  SOLN
100.0000 mL | Freq: Once | INTRAMUSCULAR | Status: AC | PRN
  Administered 2019-01-31: 100 mL via INTRAVENOUS

## 2019-01-31 MED ORDER — SODIUM CHLORIDE (PF) 0.9 % IJ SOLN
INTRAMUSCULAR | Status: AC
  Filled 2019-01-31: qty 50

## 2019-01-31 NOTE — Progress Notes (Signed)
Patient ID: Ruth Hubbard, female   DOB: 1980/10/27, 38 y.o.   MRN: [id number]    7 Days Post-Op  Subjective: Patient with no further BMs.  Nausea improved with adjustment in medications so far.  Feels more bloated today.  Had some new discomfort overnight with some pressure in the RLQ going towards her back  ROS: See above, otherwise other systems negative  Objective: Vital signs in last 24 hours: Temp:  [98.5 F (36.9 C)-99 F (37.2 C)] 98.5 F (36.9 C) (12/16 0447) Pulse Rate:  [53-60] 54 (12/16 0447) Resp:  [15-18] 15 (12/16 0447) BP: (121-129)/(57-63) 129/57 (12/16 0447) SpO2:  [98 %-100 %] 98 % (12/16 0447) Last BM Date: 01/30/19  Intake/Output from previous day: 12/15 0701 - 12/16 0700 In: 895.9 [P.O.:420; IV Piggyback:475.9] Out: 1850 [Urine:1850] Intake/Output this shift: No intake/output data recorded.  PE: Abd: soft, more bloated today than yesterday, some BS, appropriately tender, incisions c/d/i with staples.  Lab Results:  Recent Labs    01/30/19 0518 01/31/19 0442  WBC 15.2* 16.7*  HGB 11.7* 14.5  HCT 35.5* 42.5  PLT 371 229   BMET Recent Labs    01/30/19 0518  NA 137  K 4.0  CL 108  CO2 22  GLUCOSE 123*  BUN <5*  CREATININE 0.41*  CALCIUM 8.4*   PT/INR No results for input(s): LABPROT, INR in the last 72 hours. CMP     Component Value Date/Time   NA 137 01/30/2019 0518   K 4.0 01/30/2019 0518   CL 108 01/30/2019 0518   CO2 22 01/30/2019 0518   GLUCOSE 123 (H) 01/30/2019 0518   BUN <5 (L) 01/30/2019 0518   CREATININE 0.41 (L) 01/30/2019 0518   CALCIUM 8.4 (L) 01/30/2019 0518   PROT 6.3 (L) 01/24/2019 0446   ALBUMIN 3.2 (L) 01/24/2019 0446   AST 12 (L) 01/24/2019 0446   ALT 13 01/24/2019 0446   ALKPHOS 68 01/24/2019 0446   BILITOT 0.3 01/24/2019 0446   GFRNONAA >60 01/30/2019 0518   GFRAA >60 01/30/2019 0518   Lipase     Component Value Date/Time   LIPASE 40 01/23/2019 1808       Studies/Results: No results  found.  Anti-infectives: Anti-infectives (From admission, onward)   Start     Dose/Rate Route Frequency Ordered Stop   01/24/19 2200  cefTRIAXone (ROCEPHIN) 2 g in sodium chloride 0.9 % 100 mL IVPB     2 g 200 mL/hr over 30 Minutes Intravenous Every 24 hours 01/24/19 0213     01/24/19 0600  metroNIDAZOLE (FLAGYL) IVPB 500 mg     500 mg 100 mL/hr over 60 Minutes Intravenous Every 8 hours 01/24/19 0213     01/23/19 2130  cefTRIAXone (ROCEPHIN) 1 g in sodium chloride 0.9 % 100 mL IVPB     1 g 200 mL/hr over 30 Minutes Intravenous  Once 01/23/19 2118 01/23/19 2216   01/23/19 2130  metroNIDAZOLE (FLAGYL) IVPB 500 mg     500 mg 100 mL/hr over 60 Minutes Intravenous  Once 01/23/19 2118 01/23/19 2333       Assessment/Plan POD 7, s/p lap converted to open ileocecectomy for acute perforated appendicitis -more bloating today and WBC went back up to 16.7.  Given she is 7 days post op with persistent inconsistency in her bowel function, and that she had a perforated appendix with an abscess, I will order a CT scan to rule out post op complication or abscess as source of above findings. -nausea is improved  with adjustment in pain meds -mobilize -pulm toilet -check labs in am  FEN: SLIV, CLD VTE: Lovenox ID: Rocephin/Flagyl Follow up: Carolynne Edouard   LOS: 8 days    Letha Cape , [facility] 01/31/2019, 10:24 AM Please see Amion for pager number during day hours 7:00am-4:30pm

## 2019-02-01 ENCOUNTER — Inpatient Hospital Stay: Payer: Self-pay

## 2019-02-01 LAB — URINALYSIS, ROUTINE W REFLEX MICROSCOPIC
Bacteria, UA: NONE SEEN
Glucose, UA: NEGATIVE mg/dL
Hgb urine dipstick: NEGATIVE
Ketones, ur: 80 mg/dL — AB
Nitrite: NEGATIVE
Protein, ur: 100 mg/dL — AB
Specific Gravity, Urine: >1.046 — ABNORMAL HIGH (ref 1.005–1.030)
pH: 6 (ref 5.0–8.0)

## 2019-02-01 LAB — HEMOGLOBIN A1C
Hgb A1c MFr Bld: 6 % — ABNORMAL HIGH (ref 4.8–5.6)
Mean Plasma Glucose: 125.5 mg/dL

## 2019-02-01 LAB — BASIC METABOLIC PANEL
Anion gap: 14 (ref 5–15)
BUN: 10 mg/dL (ref 6–20)
CO2: 22 mmol/L (ref 22–32)
Calcium: 8.8 mg/dL — ABNORMAL LOW (ref 8.9–10.3)
Chloride: 100 mmol/L (ref 98–111)
Creatinine, Ser: 0.61 mg/dL (ref 0.44–1.00)
GFR calc Af Amer: >60 mL/min (ref >60)
GFR calc non Af Amer: >60 mL/min (ref >60)
Glucose, Bld: 74 mg/dL (ref 70–99)
Potassium: 3.7 mmol/L (ref 3.5–5.1)
Sodium: 136 mmol/L (ref 135–145)

## 2019-02-01 LAB — CBC
HCT: 38.1 % (ref 36.0–46.0)
Hemoglobin: 12.7 g/dL (ref 12.0–15.0)
MCH: 29.6 pg (ref 26.0–34.0)
MCHC: 33.3 g/dL (ref 30.0–36.0)
MCV: 88.8 fL (ref 80.0–100.0)
Platelets: 438 10*3/uL — ABNORMAL HIGH (ref 150–400)
RBC: 4.29 MIL/uL (ref 3.87–5.11)
RDW: 15 % (ref 11.5–15.5)
WBC: 16.6 10*3/uL — ABNORMAL HIGH (ref 4.0–10.5)
nRBC: 0 % (ref 0.0–0.2)

## 2019-02-01 LAB — GLUCOSE, CAPILLARY
Glucose-Capillary: 78 mg/dL (ref 70–99)
Glucose-Capillary: 84 mg/dL (ref 70–99)
Glucose-Capillary: 149 mg/dL — ABNORMAL HIGH (ref 70–99)

## 2019-02-01 MED ORDER — SODIUM CHLORIDE 0.9% FLUSH
10.0000 mL | INTRAVENOUS | Status: DC | PRN
  Administered 2019-02-05: 18:00:00 10 mL

## 2019-02-01 MED ORDER — KCL IN DEXTROSE-NACL 20-5-0.45 MEQ/L-%-% IV SOLN
INTRAVENOUS | Status: AC
  Filled 2019-02-01 (×2): qty 1000

## 2019-02-01 MED ORDER — TRAVASOL 10 % IV SOLN
INTRAVENOUS | Status: AC
  Filled 2019-02-01: qty 480

## 2019-02-01 MED ORDER — INSULIN ASPART 100 UNIT/ML ~~LOC~~ SOLN
0.0000 [IU] | Freq: Four times a day (QID) | SUBCUTANEOUS | Status: AC
  Administered 2019-02-02 (×3): 2 [IU] via SUBCUTANEOUS
  Administered 2019-02-02 – 2019-02-03 (×2): 1 [IU] via SUBCUTANEOUS
  Administered 2019-02-03: 2 [IU] via SUBCUTANEOUS
  Administered 2019-02-03: 1 [IU] via SUBCUTANEOUS
  Administered 2019-02-03: 2 [IU] via SUBCUTANEOUS
  Administered 2019-02-04: 1 [IU] via SUBCUTANEOUS
  Administered 2019-02-04: 2 [IU] via SUBCUTANEOUS
  Administered 2019-02-04 (×2): 1 [IU] via SUBCUTANEOUS
  Administered 2019-02-05: 2 [IU] via SUBCUTANEOUS
  Administered 2019-02-05 (×2): 1 [IU] via SUBCUTANEOUS

## 2019-02-01 MED ORDER — ACETAMINOPHEN 10 MG/ML IV SOLN
1000.0000 mg | Freq: Four times a day (QID) | INTRAVENOUS | Status: AC
  Administered 2019-02-01 – 2019-02-02 (×4): 1000 mg via INTRAVENOUS
  Filled 2019-02-01 (×4): qty 100

## 2019-02-01 MED ORDER — METHOCARBAMOL 1000 MG/10ML IJ SOLN
500.0000 mg | Freq: Three times a day (TID) | INTRAVENOUS | Status: DC
  Administered 2019-02-01 – 2019-02-05 (×12): 500 mg via INTRAVENOUS
  Filled 2019-02-01: qty 5
  Filled 2019-02-01 (×4): qty 500
  Filled 2019-02-01 (×2): qty 5
  Filled 2019-02-01 (×5): qty 500
  Filled 2019-02-01: qty 5
  Filled 2019-02-01 (×2): qty 500
  Filled 2019-02-01: qty 5
  Filled 2019-02-01: qty 500

## 2019-02-01 MED ORDER — SODIUM CHLORIDE 0.9% FLUSH
10.0000 mL | Freq: Two times a day (BID) | INTRAVENOUS | Status: DC
  Administered 2019-02-01: 22:00:00 10 mL

## 2019-02-01 MED ORDER — ALUM & MAG HYDROXIDE-SIMETH 200-200-20 MG/5ML PO SUSP
15.0000 mL | Freq: Four times a day (QID) | ORAL | Status: DC | PRN
  Administered 2019-02-01 – 2019-02-05 (×2): 15 mL via ORAL
  Filled 2019-02-01 (×2): qty 30

## 2019-02-01 MED ORDER — CHLORHEXIDINE GLUCONATE CLOTH 2 % EX PADS
6.0000 | MEDICATED_PAD | Freq: Every day | CUTANEOUS | Status: DC
  Administered 2019-02-01 – 2019-02-05 (×5): 6 via TOPICAL

## 2019-02-01 NOTE — Progress Notes (Signed)
Initial Nutrition Assessment  INTERVENTION:   Monitor magnesium, potassium, and phosphorus daily for at least 3 days, MD to replete as needed, as pt is at risk for refeeding syndrome.  -TPN per Pharmacy  NUTRITION DIAGNOSIS:   Inadequate oral intake related to nausea, vomiting(prolonged ileus) as evidenced by NPO status.  GOAL:   Patient will meet greater than or equal to 90% of their needs  MONITOR:   Labs, Weight trends, I & O's(TPN)  REASON FOR ASSESSMENT:   LOS, Consult, NPO/Clear Liquid Diet New TPN/TNA  ASSESSMENT:   38 year old female who presented to the emergency department with complaints of abdominal pain.  Patient states pain started roughly 10 days ago in her right lower back and lower abdomen. Admitted for acute appendicitis.  12/8: admitted for acute appendicitis 12/9: s/p open ileocecectomy  **RD working remotely**  Patient with prolonged post-op ileus following ileocecectomy on 12/9. Pt has been NPO/CLD since admission. Surgery ordered PICC line to be placed for TPN today.  Pt has been unable to tolerate liquids, nausea has been persistent and pt had an episode of vomiting yesterday.   Per Pharmacy note, pt to start TPN at 40 ml/hr (providing 873 kcals, 48g protein).  Per weight records, pt has lost 16 lbs since January 2020 (9% wt loss x 11 months, insignificant for time frame).   I/Os: +4L since admit UOP: 500 ml x 24 hrs  Labs reviewed. Medications: IV Zofran PRN  NUTRITION - FOCUSED PHYSICAL EXAM:  Working remotely.  Diet Order:   Diet Order            Diet NPO time specified Except for: Ice Chips, Sips with Meds, Other (See Comments)  Diet effective now              EDUCATION NEEDS:   No education needs have been identified at this time  Skin:  Skin Assessment: Reviewed RN Assessment  Last BM:  12/16  Height:   Ht Readings from Last 1 Encounters:  01/23/19 5\' 4"  (1.626 m)    Weight:   Wt Readings from Last 1 Encounters:   01/23/19 68 kg    Ideal Body Weight:  54.5 kg  BMI:  Body mass index is 25.73 kg/m.  Estimated Nutritional Needs:   Kcal:  1700-1900  Protein:  80-90g  Fluid:  1.9L/day  Clayton Bibles, MS, RD, LDN Inpatient Clinical Dietitian Pager: [phone] After Hours Pager: [phone]

## 2019-02-01 NOTE — Progress Notes (Signed)
PHARMACY - TOTAL PARENTERAL NUTRITION CONSULT NOTE   Indication: prolonged ileus  Patient Measurements: Height: 5\' 4"  (162.6 cm) Weight: 149 lb 14.6 oz (68 kg) IBW/kg (Calculated) : 54.7 TPN AdjBW (KG): 68 Body mass index is 25.73 kg/m. Usual Weight:   Assessment:   Glucose / Insulin: <150, no history of diabetes Electrolytes: WNL Renal: WNL LFTs / TGs: will obtain with am labs Prealbumin / albumin: will obtain with am labs GI Imaging: ileus versus mechanical obstruction.  Surgeries / Procedures: Acute appendicitis with perforation Laparoscopic converted to open ileocecectomy for acute appendicitis perforation, 01/24/2019   Central access: PICC ordered for today TPN start date: 12/17  Nutritional Goals (per RD recommendation: pending ) Current Nutrition:  Ensure ordered bid, not taking  Plan:  -Start TPN at 29mL/hr at 1800 -Electrolytes in TPN: 45mEq/L of Na, 58mEq/L of K, 29mEq/L of Ca, 73mEq/L of Mg, and 32mmol/L of Phos. Cl:Ac ratio 1:1 -Add standard MVI on MWF due to national backorder, trace elements daily -Initiate sensitive SSI and adjust as needed  -IVF per surgery - TPN labs tomorrow and on Mon/Thurs  Dolly Rias RPh 02/01/2019, 10:03 AM

## 2019-02-01 NOTE — Progress Notes (Signed)
Encourage patient to ambulated. Patient refused to ambulate. She stated she only ambulates early morning and at night. Not during the day when there are other people in hallways.

## 2019-02-01 NOTE — Progress Notes (Signed)
Peripherally Inserted Central Catheter/Midline Placement  The IV Nurse has discussed with the patient and/or persons authorized to consent for the patient, the purpose of this procedure and the potential benefits and risks involved with this procedure.  The benefits include less needle sticks, lab draws from the catheter, and the patient may be discharged home with the catheter. Risks include, but not limited to, infection, bleeding, blood clot (thrombus formation), and puncture of an artery; nerve damage and irregular heartbeat and possibility to perform a PICC exchange if needed/ordered by physician.  Alternatives to this procedure were also discussed.  Bard Power PICC patient education guide, fact sheet on infection prevention and patient information card has been provided to patient /or left at bedside.    PICC/Midline Placement Documentation  PICC Double Lumen 18/29/93 PICC Right Basilic 37 cm 0 cm (Active)  Indication for Insertion or Continuance of Line Administration of hyperosmolar/irritating solutions (i.e. TPN, Vancomycin, etc.) 02/01/19 1700  Exposed Catheter (cm) 0 cm 02/01/19 1700  Site Assessment Clean;Dry;Intact 02/01/19 1700  Lumen #1 Status Flushed;Blood return noted 02/01/19 1700  Lumen #2 Status Flushed;Blood return noted 02/01/19 1700  Dressing Type Transparent 02/01/19 1700  Dressing Status Clean;Dry;Intact;Antimicrobial disc in place 02/01/19 1700  Dressing Change Due 02/08/19 02/01/19 1700       Jule Economy Horton 02/01/2019, 5:10 PM

## 2019-02-01 NOTE — Progress Notes (Addendum)
8 Days Post-Op    CC: Abdominal pain  Subjective: Patient has not changed a great deal since yesterday.  She is having bloating, then nausea after taking p.o.'s.  She had one episode of vomiting yesterday.  She did have a small amount of stool yesterday.  Her midline incision looks okay.  Bowel sounds are hypoactive and high-pitched.  Objective: Vital signs in last 24 hours: Temp:  [98.8 F (37.1 C)-99.8 F (37.7 C)] 99 F (37.2 C) (12/17 0548) Pulse Rate:  [60-68] 65 (12/17 0548) Resp:  [17-20] 20 (12/17 0548) BP: (115-123)/(58-69) 122/69 (12/17 0548) SpO2:  [93 %-98 %] 98 % (12/17 0548) Last BM Date: 01/31/19 360 p.o. recorded 358 IV recorded 400 urine recorded Emesis x1 Stool x1 Labs: BMP is normal WBC 16.6  H/H 12.7/38.1 Platelets 438,000. CT scan 12/16: Atelectasis right base bilateral small effusions.  Moderately distended stomach with fluid duodenum appears normal, the proximal mid and distal small bowel are fluid-filled distended loops measuring 4.8 and 4.4 cm.  Distal small bowel is decompressed.  Impression was ileus versus mechanical obstruction.  No portal venous gas or pneumatosis, no evidence of perforation.  Small volume of free fluid posterior cul-de-sac with enhancing peritoneal surface concerning for peritonitis.  The stomach showed moderate volume of fluid questionable NG decompression needed.  Intake/Output from previous day: 12/16 0701 - 12/17 0700 In: 718 [P.O.:360; IV Piggyback:358] Out: 400 [Urine:400] Intake/Output this shift: No intake/output data recorded.  General appearance: alert, cooperative and no distress Resp: clear to auscultation bilaterally GI: Abdomen is minimally distended.  Midline incision is okay.  Bowel sounds are high-pitched and hypoactive.  She did have a little bit of stool yesterday.  Lab Results:  Recent Labs    01/31/19 0442 02/01/19 0430  WBC 16.7* 16.6*  HGB 14.5 12.7  HCT 42.5 38.1  PLT 229 438*    BMET Recent  Labs    01/30/19 0518 02/01/19 0430  NA 137 136  K 4.0 3.7  CL 108 100  CO2 22 22  GLUCOSE 123* 74  BUN <5* 10  CREATININE 0.41* 0.61  CALCIUM 8.4* 8.8*   PT/INR No results for input(s): LABPROT, INR in the last 72 hours.  No results for input(s): AST, ALT, ALKPHOS, BILITOT, PROT, ALBUMIN in the last 168 hours.   Lipase     Component Value Date/Time   LIPASE 40 01/23/2019 1808     Prior to Admission medications   Medication Sig Start Date End Date Taking? Authorizing Provider  ascorbic acid (VITAMIN C) 250 MG CHEW Chew 250 mg by mouth daily.   Yes [provider]  Biotin w/ Vitamins C & E (HAIR SKIN & NAILS GUMMIES) 1250-7.5-7.5 MCG-MG-UNT CHEW Chew 1 tablet by mouth daily.   Yes [provider]  Brimonidine Tartrate (LUMIFY) 0.025 % SOLN Place 1 drop into both eyes daily.   Yes [provider]  Cyanocobalamin (VITAMIN B-12 ER PO) Take 1 tablet by mouth daily.   Yes [provider]  VITAMIN E PO Take 1 capsule by mouth daily.   Yes [provider]    Medications: . acetaminophen  1,000 mg Oral Q6H  . enoxaparin (LOVENOX) injection  40 mg Subcutaneous Q24H  . feeding supplement (ENSURE ENLIVE)  237 mL Oral BID BM  . influenza vac split quadrivalent PF  0.5 mL Intramuscular Tomorrow-1000  . ketorolac  30 mg Intravenous Q8H  . pantoprazole (PROTONIX) IV  40 mg Intravenous Q24H   . cefTRIAXone (ROCEPHIN)  IV  2 g (01/31/19 2214)   And  . metronidazole 500 mg (02/01/19 8616)    Assessment/Plan Postop ileus Malnutrition -prealbumin pending  Acute appendicitis with perforation Laparoscopic converted to open ileocecectomy for acute appendicitis perforation, 01/24/2019 Dr. Mamie Nick. Toth III POD #8  FEN: Clear liquids ID: Rocephin/Flagyl 12/8>> day 9 DVT: Lovenox Follow-up: Dr. Marlou Starks  Plan: She is 8 days postop.  I am going to order a PICC line and TNA.  Go to try and avoid NG tube, just cut her back to sips and chips for oral  comfort.  Continue IV antibiotics.  We will review the CT with Dr. Redmond Pulling, and call her sister later.    LOS: 9 days    Ruth Hubbard 02/01/2019 Please see Amion

## 2019-02-02 LAB — COMPREHENSIVE METABOLIC PANEL
ALT: 15 U/L (ref 0–44)
AST: 18 U/L (ref 15–41)
Albumin: 3 g/dL — ABNORMAL LOW (ref 3.5–5.0)
Alkaline Phosphatase: 58 U/L (ref 38–126)
Anion gap: 10 (ref 5–15)
BUN: 7 mg/dL (ref 6–20)
CO2: 24 mmol/L (ref 22–32)
Calcium: 8.7 mg/dL — ABNORMAL LOW (ref 8.9–10.3)
Chloride: 103 mmol/L (ref 98–111)
Creatinine, Ser: 0.57 mg/dL (ref 0.44–1.00)
GFR calc Af Amer: >60 mL/min (ref >60)
GFR calc non Af Amer: >60 mL/min (ref >60)
Glucose, Bld: 144 mg/dL — ABNORMAL HIGH (ref 70–99)
Potassium: 3.7 mmol/L (ref 3.5–5.1)
Sodium: 137 mmol/L (ref 135–145)
Total Bilirubin: 0.7 mg/dL (ref 0.3–1.2)
Total Protein: 6.4 g/dL — ABNORMAL LOW (ref 6.5–8.1)

## 2019-02-02 LAB — DIFFERENTIAL
Abs Immature Granulocytes: 0.41 10*3/uL — ABNORMAL HIGH (ref 0.00–0.07)
Basophils Absolute: 0.1 10*3/uL (ref 0.0–0.1)
Basophils Relative: 0 %
Eosinophils Absolute: 0.2 10*3/uL (ref 0.0–0.5)
Eosinophils Relative: 1 %
Immature Granulocytes: 2 %
Lymphocytes Relative: 13 %
Lymphs Abs: 2.3 10*3/uL (ref 0.7–4.0)
Monocytes Absolute: 1.8 10*3/uL — ABNORMAL HIGH (ref 0.1–1.0)
Monocytes Relative: 11 %
Neutro Abs: 12.3 10*3/uL — ABNORMAL HIGH (ref 1.7–7.7)
Neutrophils Relative %: 73 %

## 2019-02-02 LAB — CBC
HCT: 36.4 % (ref 36.0–46.0)
Hemoglobin: 12.1 g/dL (ref 12.0–15.0)
MCH: 29.7 pg (ref 26.0–34.0)
MCHC: 33.2 g/dL (ref 30.0–36.0)
MCV: 89.2 fL (ref 80.0–100.0)
Platelets: 429 10*3/uL — ABNORMAL HIGH (ref 150–400)
RBC: 4.08 MIL/uL (ref 3.87–5.11)
RDW: 15.2 % (ref 11.5–15.5)
WBC: 17 10*3/uL — ABNORMAL HIGH (ref 4.0–10.5)
nRBC: 0 % (ref 0.0–0.2)

## 2019-02-02 LAB — GLUCOSE, CAPILLARY
Glucose-Capillary: 128 mg/dL — ABNORMAL HIGH (ref 70–99)
Glucose-Capillary: 146 mg/dL — ABNORMAL HIGH (ref 70–99)
Glucose-Capillary: 148 mg/dL — ABNORMAL HIGH (ref 70–99)
Glucose-Capillary: 151 mg/dL — ABNORMAL HIGH (ref 70–99)
Glucose-Capillary: 152 mg/dL — ABNORMAL HIGH (ref 70–99)
Glucose-Capillary: 153 mg/dL — ABNORMAL HIGH (ref 70–99)

## 2019-02-02 LAB — PREALBUMIN: Prealbumin: 20.3 mg/dL (ref 18–38)

## 2019-02-02 LAB — MAGNESIUM: Magnesium: 1.8 mg/dL (ref 1.7–2.4)

## 2019-02-02 LAB — TRIGLYCERIDES: Triglycerides: 69 mg/dL (ref <150)

## 2019-02-02 LAB — PHOSPHORUS: Phosphorus: 2.8 mg/dL (ref 2.5–4.6)

## 2019-02-02 MED ORDER — TRAVASOL 10 % IV SOLN
INTRAVENOUS | Status: AC
  Filled 2019-02-02: qty 840

## 2019-02-02 MED ORDER — KCL IN DEXTROSE-NACL 20-5-0.45 MEQ/L-%-% IV SOLN
INTRAVENOUS | Status: DC
  Filled 2019-02-02: qty 1000

## 2019-02-02 NOTE — Progress Notes (Signed)
Patient ID: Ruth Hubbard, female   DOB: [birth year], 38 y.o.   MRN: [id number]    9 Days Post-Op  Subjective: Patient smiling when I walked in.  Her sister is present this morning.  She had a little nausea with a small amount of emesis last night around 10 which seems to be the time each day this happens.  We discussed the need for insulin as well since TNA started yesterday.  She has had 3 BMs since last night.  Walked this am around 0400.  ROS: See above, otherwise other systems negative  Objective: Vital signs in last 24 hours: Temp:  [98.3 F (36.8 C)-99.6 F (37.6 C)] 98.3 F (36.8 C) (12/18 0459) Pulse Rate:  [65-69] 65 (12/18 0459) Resp:  [18-21] 20 (12/18 0459) BP: (118-134)/(65-70) 118/70 (12/18 0459) SpO2:  [97 %-99 %] 99 % (12/18 0459) Last BM Date: 02/02/19  Intake/Output from previous day: 12/17 0701 - 12/18 0700 In: 627.5 [P.O.:120; I.V.:57.5; IV Piggyback:450] Out: 500 [Urine:500] Intake/Output this shift: Total I/O In: 460 [I.V.:460] Out: -   PE: Heart: regular Lungs: CTAB Abd: soft, but still distended some, +BS, appropriately tender, incisions c/d/i with staples.  Lab Results:  Recent Labs    02/01/19 0430 02/02/19 0505  WBC 16.6* 17.0*  HGB 12.7 12.1  HCT 38.1 36.4  PLT 438* 429*   BMET Recent Labs    02/01/19 0430 02/02/19 0505  NA 136 137  K 3.7 3.7  CL 100 103  CO2 22 24  GLUCOSE 74 144*  BUN 10 7  CREATININE 0.61 0.57  CALCIUM 8.8* 8.7*   PT/INR No results for input(s): LABPROT, INR in the last 72 hours. CMP     Component Value Date/Time   NA 137 02/02/2019 0505   K 3.7 02/02/2019 0505   CL 103 02/02/2019 0505   CO2 24 02/02/2019 0505   GLUCOSE 144 (H) 02/02/2019 0505   BUN 7 02/02/2019 0505   CREATININE 0.57 02/02/2019 0505   CALCIUM 8.7 (L) 02/02/2019 0505   PROT 6.4 (L) 02/02/2019 0505   ALBUMIN 3.0 (L) 02/02/2019 0505   AST 18 02/02/2019 0505   ALT 15 02/02/2019 0505   ALKPHOS 58 02/02/2019 0505   BILITOT 0.7  02/02/2019 0505   GFRNONAA >60 02/02/2019 0505   GFRAA >60 02/02/2019 0505   Lipase     Component Value Date/Time   LIPASE 40 01/23/2019 1808       Studies/Results: CT ABDOMEN PELVIS W CONTRAST  Result Date: 01/31/2019 CLINICAL DATA:  Abdominal distension status post ileocecal ectomy perforated appendicitis with abscess. EXAM: CT ABDOMEN AND PELVIS WITH CONTRAST TECHNIQUE: Multidetector CT imaging of the abdomen and pelvis was performed using the standard protocol following bolus administration of intravenous contrast. CONTRAST:  123mL OMNIPAQUE IOHEXOL 300 MG/ML  SOLN COMPARISON:  CT 01/23/2019 FINDINGS: Lower chest: Band atelectasis at the RIGHT lung base. Bilateral small effusions. Hepatobiliary: No focal hepatic lesion. Gallbladder normal. No biliary duct dilatation Pancreas: Pancreas is normal. No ductal dilatation. No pancreatic inflammation. Spleen: Normal spleen Adrenals/urinary tract: Adrenal glands and kidneys are normal. The ureters and bladder normal. Stomach/Bowel: The stomach is moderately distended with fluid. Duodenum appears normal. The proximal, mid and distal small bowel are fluid-filled and distended. For example bowel loop in the LEFT abdomen measuring 4.8 cm (image 53/2). Loop of bowel in the mid abdomen measuring 4.4 cm (image 56/2 The most distal small bowel in the RIGHT lower quadrant is decompressed. There is an anastomosis in the ascending  colon. The small bowel leading up to this anastomosis (image 53/2) is decompressed. The ascending, transverse and descending colon are completely decompressed. No pneumatosis of the small bowel.  No portal venous gas. Vascular/Lymphatic: Abdominal aorta is normal caliber with atherosclerotic calcification. There is no retroperitoneal or periportal lymphadenopathy. No pelvic lymphadenopathy. Reproductive: Uterus and adnexa normal. Other: There is small volume free fluid the posterior cul-de-sac. This small amount fluid does have a thin  enhancing rim (image 74/2) suggesting peritonitis Musculoskeletal: No aggressive osseous lesion. IMPRESSION: 1. Markedly distended proximal small bowel and mid small bowel with transition to decompressed distal small bowel in the RIGHT lower quadrant and decompressed colon. Differential includes postsurgical ileus versus mechanical obstruction. 2. No portal venous gas or pneumatosis.  No evidence of perforation 3. Small volume of free fluid posterior cul-de-sac with enhancing peritoneal surface is concerning for peritonitis. 4. Moderate volume of fluid the stomach. Patient may benefit from NG tube decompression Electronically Signed   By: Genevive Bi M.D.   On: 01/31/2019 17:17   [place] EKG SITE RITE  Result Date: 02/01/2019 If Site Rite image not attached, placement could not be confirmed due to current cardiac rhythm.   Anti-infectives: Anti-infectives (From admission, onward)   Start     Dose/Rate Route Frequency Ordered Stop   01/24/19 2200  cefTRIAXone (ROCEPHIN) 2 g in sodium chloride 0.9 % 100 mL IVPB     2 g 200 mL/hr over 30 Minutes Intravenous Every 24 hours 01/24/19 0213     01/24/19 0600  metroNIDAZOLE (FLAGYL) IVPB 500 mg     500 mg 100 mL/hr over 60 Minutes Intravenous Every 8 hours 01/24/19 0213     01/23/19 2130  cefTRIAXone (ROCEPHIN) 1 g in sodium chloride 0.9 % 100 mL IVPB     1 g 200 mL/hr over 30 Minutes Intravenous  Once 01/23/19 2118 01/23/19 2216   01/23/19 2130  metroNIDAZOLE (FLAGYL) IVPB 500 mg     500 mg 100 mL/hr over 60 Minutes Intravenous  Once 01/23/19 2118 01/23/19 2333       Assessment/Plan POD9, s/p lap converted to open ileocecectomy for acute perforated appendicitis -PICC/TNA in place, had long discussion regarding this and how this affects blood glucose and the need for insuline right now while on this.  We discussed that this will not give her diabetes moving forward and helped clarify that -WBC stable around 17K.  CT with no overt evidence of  infection.  UA negative yesterday.  At some point may just need to stop abx therapy as there is no obvious source to explain WBC. -cont NPO x sips and chips while awaiting resolution of ileus -rocking chair to her room to help with ileus -mobilize -pulm toilet -check labs in am  MVH:QION and chips/TNA VTE: Lovenox ID: Rocephin/Flagyl Follow up: Carolynne Edouard   LOS: 10 days    Letha Cape , [facility] 02/02/2019, 11:35 AM Please see Amion for pager number during day hours 7:00am-4:30pm

## 2019-02-02 NOTE — Progress Notes (Signed)
I concur with previous RN assessment documentation.  

## 2019-02-02 NOTE — Progress Notes (Addendum)
PHARMACY - TOTAL PARENTERAL NUTRITION CONSULT NOTE   Indication: prolonged ileus  Patient Measurements: Height: 5\' 4"  (162.6 cm) Weight: 149 lb 14.6 oz (68 kg) IBW/kg (Calculated) : 54.7 TPN AdjBW (KG): 68 Body mass index is 25.73 kg/m. Usual Weight:   Assessment:   Glucose / Insulin: <150, received 3 units of insulin since starting TPN Electrolytes: WNL Renal: WNL LFTs / TGs: WNL/ will obtain TG labs tomorrow Prealbumin / albumin: 20.3 GI Imaging: ileus versus mechanical obstruction.  Surgeries / Procedures: Acute appendicitis with perforation Laparoscopic converted to open ileocecectomy for acute appendicitis perforation, 01/24/2019   Central access: 12/17 TPN start date: 12/17  Nutritional Goals (per RD recommendation:) kCal: 1700-1900, Protein: 80-90g, Fluid: 1.9L/day Goal TPN rate is 70 mL/hr (provides 84 g of protein and 1700 kcals per day) Current Nutrition:  NPO  Plan:  -Increase TPN to 23mL/hr at 1800 -Electrolytes in TPN: 4mEq/L of Na, 40mEq/L of K, 76mEq/L of Ca, 52mEq/L of Mg, and 39mmol/L of Phos. Cl:Ac ratio 1:1 -Add standard MVI on MWF due to national backorder, trace elements daily -Continue sensitive SSI and adjust as needed  -decrease IV fluid to 38mls/hr - TPN labs on Mon/Thurs - Bmet with mag and phos tomorrow  Dolly Rias RPh 02/02/2019, 7:43 AM

## 2019-02-03 LAB — BASIC METABOLIC PANEL
Anion gap: 7 (ref 5–15)
BUN: 8 mg/dL (ref 6–20)
CO2: 22 mmol/L (ref 22–32)
Calcium: 8.4 mg/dL — ABNORMAL LOW (ref 8.9–10.3)
Chloride: 107 mmol/L (ref 98–111)
Creatinine, Ser: 0.45 mg/dL (ref 0.44–1.00)
GFR calc Af Amer: >60 mL/min (ref >60)
GFR calc non Af Amer: >60 mL/min (ref >60)
Glucose, Bld: 151 mg/dL — ABNORMAL HIGH (ref 70–99)
Potassium: 4.1 mmol/L (ref 3.5–5.1)
Sodium: 136 mmol/L (ref 135–145)

## 2019-02-03 LAB — CBC
HCT: 35.5 % — ABNORMAL LOW (ref 36.0–46.0)
HCT: 34.5 % — ABNORMAL LOW (ref 36.0–46.0)
Hemoglobin: 11.1 g/dL — ABNORMAL LOW (ref 12.0–15.0)
Hemoglobin: 11.5 g/dL — ABNORMAL LOW (ref 12.0–15.0)
MCH: 30.9 pg (ref 26.0–34.0)
MCH: 30.1 pg (ref 26.0–34.0)
MCHC: 31.3 g/dL (ref 30.0–36.0)
MCHC: 33.3 g/dL (ref 30.0–36.0)
MCV: 98.9 fL (ref 80.0–100.0)
MCV: 90.3 fL (ref 80.0–100.0)
Platelets: 382 10*3/uL (ref 150–400)
Platelets: 399 10*3/uL (ref 150–400)
RBC: 3.59 MIL/uL — ABNORMAL LOW (ref 3.87–5.11)
RBC: 3.82 MIL/uL — ABNORMAL LOW (ref 3.87–5.11)
RDW: 17.1 % — ABNORMAL HIGH (ref 11.5–15.5)
RDW: 15.8 % — ABNORMAL HIGH (ref 11.5–15.5)
WBC: 13.9 10*3/uL — ABNORMAL HIGH (ref 4.0–10.5)
WBC: 15.3 10*3/uL — ABNORMAL HIGH (ref 4.0–10.5)
nRBC: 0 % (ref 0.0–0.2)
nRBC: 0 % (ref 0.0–0.2)

## 2019-02-03 LAB — COMPREHENSIVE METABOLIC PANEL
ALT: 12 U/L (ref 0–44)
AST: 11 U/L — ABNORMAL LOW (ref 15–41)
Albumin: 2.6 g/dL — ABNORMAL LOW (ref 3.5–5.0)
Alkaline Phosphatase: 46 U/L (ref 38–126)
Anion gap: 7 (ref 5–15)
BUN: 9 mg/dL (ref 6–20)
CO2: 23 mmol/L (ref 22–32)
Calcium: 8.4 mg/dL — ABNORMAL LOW (ref 8.9–10.3)
Chloride: 106 mmol/L (ref 98–111)
Creatinine, Ser: 0.47 mg/dL (ref 0.44–1.00)
GFR calc Af Amer: >60 mL/min (ref >60)
GFR calc non Af Amer: >60 mL/min (ref >60)
Glucose, Bld: 152 mg/dL — ABNORMAL HIGH (ref 70–99)
Potassium: 4.1 mmol/L (ref 3.5–5.1)
Sodium: 136 mmol/L (ref 135–145)
Total Bilirubin: 0.2 mg/dL — ABNORMAL LOW (ref 0.3–1.2)
Total Protein: 5.9 g/dL — ABNORMAL LOW (ref 6.5–8.1)

## 2019-02-03 LAB — GLUCOSE, CAPILLARY
Glucose-Capillary: 150 mg/dL — ABNORMAL HIGH (ref 70–99)
Glucose-Capillary: 162 mg/dL — ABNORMAL HIGH (ref 70–99)
Glucose-Capillary: 135 mg/dL — ABNORMAL HIGH (ref 70–99)
Glucose-Capillary: 152 mg/dL — ABNORMAL HIGH (ref 70–99)
Glucose-Capillary: 123 mg/dL — ABNORMAL HIGH (ref 70–99)

## 2019-02-03 LAB — PHOSPHORUS: Phosphorus: 2.9 mg/dL (ref 2.5–4.6)

## 2019-02-03 LAB — TRIGLYCERIDES: Triglycerides: 81 mg/dL (ref <150)

## 2019-02-03 LAB — MAGNESIUM: Magnesium: 1.8 mg/dL (ref 1.7–2.4)

## 2019-02-03 MED ORDER — DEXTROSE-NACL 5-0.45 % IV SOLN: INTRAVENOUS | Status: DC

## 2019-02-03 MED ORDER — TRAVASOL 10 % IV SOLN
INTRAVENOUS | Status: AC
  Filled 2019-02-03: qty 883.2

## 2019-02-03 NOTE — Progress Notes (Signed)
Patient ID: Ruth Hubbard, female   DOB: 05-17-1980, 38 y.o.   MRN: [id number]     Assessment & Plan: POD#10 - s/p lap converted to open ileocecectomy for acute perforated appendicitis  PICC/TNA  WBC 13.9 this AM  Loose BM's this AM, begin sips clear liquids today - no trays  Encouraged OOB, ambulation        Armandina Gemma, MD       [facility]       Office: [phone]   Chief Complaint: Perforated acute appendicitis  Subjective: Patient in bathroom, room, nursing at bedside.  Complains of diarrheal stools overnight.  Denies nausea or emesis.  Objective: Vital signs in last 24 hours: Temp:  [98.3 F (36.8 C)-99.8 F (37.7 C)] 98.3 F (36.8 C) (12/19 0456) Pulse Rate:  [70-86] 81 (12/19 0456) Resp:  [16-19] 19 (12/19 0456) BP: (111-127)/(61-71) 117/67 (12/19 0456) SpO2:  [94 %-100 %] 99 % (12/19 0456) Last BM Date: 02/02/19  Intake/Output from previous day: 12/18 0701 - 12/19 0700 In: 1723.3 [I.V.:1248.3; IV Piggyback:475] Out: 550 [Urine:550] Intake/Output this shift: No intake/output data recorded.  Physical Exam: HEENT - sclerae clear, mucous membranes moist Neck - soft Abdomen - soft, mild distension, quiet; wounds dry and intact Ext - no edema, non-tender Neuro - alert & oriented, no focal deficits  Lab Results:  Recent Labs    02/02/19 0505 02/03/19 0354  WBC 17.0* 13.9*  HGB 12.1 11.1*  HCT 36.4 35.5*  PLT 429* 382   BMET Recent Labs    02/01/19 0430 02/02/19 0505  NA 136 137  K 3.7 3.7  CL 100 103  CO2 22 24  GLUCOSE 74 144*  BUN 10 7  CREATININE 0.61 0.57  CALCIUM 8.8* 8.7*   PT/INR No results for input(s): LABPROT, INR in the last 72 hours. Comprehensive Metabolic Panel:    Component Value Date/Time   NA 137 02/02/2019 0505   NA 136 02/01/2019 0430   K 3.7 02/02/2019 0505   K 3.7 02/01/2019 0430   CL 103 02/02/2019 0505   CL 100 02/01/2019 0430   CO2 24 02/02/2019 0505   CO2 22 02/01/2019 0430   BUN 7  02/02/2019 0505   BUN 10 02/01/2019 0430   CREATININE 0.57 02/02/2019 0505   CREATININE 0.61 02/01/2019 0430   GLUCOSE 144 (H) 02/02/2019 0505   GLUCOSE 74 02/01/2019 0430   CALCIUM 8.7 (L) 02/02/2019 0505   CALCIUM 8.8 (L) 02/01/2019 0430   AST 18 02/02/2019 0505   AST 12 (L) 01/24/2019 0446   ALT 15 02/02/2019 0505   ALT 13 01/24/2019 0446   ALKPHOS 58 02/02/2019 0505   ALKPHOS 68 01/24/2019 0446   BILITOT 0.7 02/02/2019 0505   BILITOT 0.3 01/24/2019 0446   PROT 6.4 (L) 02/02/2019 0505   PROT 6.3 (L) 01/24/2019 0446   ALBUMIN 3.0 (L) 02/02/2019 0505   ALBUMIN 3.2 (L) 01/24/2019 0446    Studies/Results: [place] EKG SITE RITE  Result Date: 02/01/2019 If Site Rite image not attached, placement could not be confirmed due to current cardiac rhythm.     Armandina Gemma 02/03/2019

## 2019-02-03 NOTE — Progress Notes (Signed)
PHARMACY - TOTAL PARENTERAL NUTRITION CONSULT NOTE   Indication: prolonged ileus  Patient Measurements: Height: 5\' 4"  (162.6 cm) Weight: 149 lb 14.6 oz (68 kg) IBW/kg (Calculated) : 54.7 TPN AdjBW (KG): 68 Body mass index is 25.73 kg/m.  Assessment: Pt presented to ED on 12/9 with CC of abdominal pain. Underwent open ileocecectomy on 12/9.   Glucose / Insulin: BG 135-152 (goal 100-150), received 7 units of SSI/24 hrs Electrolytes: WNL including corrected Ca Renal: SCr/BUN - WNL, stable LFTs / TGs: WNL (AST 11/ALT 12 on 12/19) Prealbumin / albumin: 20.3 (12/18) GI Imaging:  12/16 CT Abdomen: ileus versus mechanical obstruction.  Surgeries / Procedures: Acute appendicitis with perforation Laparoscopic converted to open ileocecectomy for acute appendicitis perforation, 01/24/2019   Central access: 12/17 TPN start date: 12/17  Nutritional Goals (per RD recommendation: 12/17)  kCal: 1700-1900, Protein: 80-90g, Fluid: 1.9L/day  Goal TPN rate at 80 mL/hr (provides 88 g of protein and 1886 kcals per day)  Current Nutrition:  NPO except sips with meds/ice chips,   Plan:   Increase TPN to goal rate of 80 mL/hr at 1800  Electrolytes in TPN: standard, no changes  89mEq/L of Na, 66mEq/L of K, 26mEq/L of Ca, 51mEq/L of Mg, and 86mmol/L of Phos. Cl:Ac ratio 1:1  Add standard MVI on MWF due to national backorder, trace elements daily  Continue sensitive SSI q6h  Remove KCl from IV maintenance fluids, decrease rate to KVO  TPN labs on Mon/Thurs  Recheck electrolytes with AM labs tomorrow  Lenis Noon, PharmD 02/03/19 11:26 AM

## 2019-02-04 LAB — BASIC METABOLIC PANEL
Anion gap: 7 (ref 5–15)
BUN: 10 mg/dL (ref 6–20)
CO2: 22 mmol/L (ref 22–32)
Calcium: 8.6 mg/dL — ABNORMAL LOW (ref 8.9–10.3)
Chloride: 108 mmol/L (ref 98–111)
Creatinine, Ser: 0.49 mg/dL (ref 0.44–1.00)
GFR calc Af Amer: >60 mL/min (ref >60)
GFR calc non Af Amer: >60 mL/min (ref >60)
Glucose, Bld: 152 mg/dL — ABNORMAL HIGH (ref 70–99)
Potassium: 4.5 mmol/L (ref 3.5–5.1)
Sodium: 137 mmol/L (ref 135–145)

## 2019-02-04 LAB — GLUCOSE, CAPILLARY
Glucose-Capillary: 120 mg/dL — ABNORMAL HIGH (ref 70–99)
Glucose-Capillary: 122 mg/dL — ABNORMAL HIGH (ref 70–99)
Glucose-Capillary: 135 mg/dL — ABNORMAL HIGH (ref 70–99)
Glucose-Capillary: 143 mg/dL — ABNORMAL HIGH (ref 70–99)
Glucose-Capillary: 151 mg/dL — ABNORMAL HIGH (ref 70–99)

## 2019-02-04 LAB — MAGNESIUM: Magnesium: 1.9 mg/dL (ref 1.7–2.4)

## 2019-02-04 LAB — PHOSPHORUS: Phosphorus: 3.5 mg/dL (ref 2.5–4.6)

## 2019-02-04 MED ORDER — BISACODYL 10 MG RE SUPP: 10.0000 mg | Freq: Two times a day (BID) | RECTAL | Status: DC | PRN

## 2019-02-04 MED ORDER — PSYLLIUM 95 % PO PACK
1.0000 | PACK | Freq: Every day | ORAL | Status: DC
  Administered 2019-02-04 – 2019-02-05 (×2): 1 via ORAL
  Filled 2019-02-04 (×3): qty 1

## 2019-02-04 MED ORDER — METHOCARBAMOL 1000 MG/10ML IJ SOLN: 1000.0000 mg | Freq: Four times a day (QID) | INTRAVENOUS | Status: DC | PRN

## 2019-02-04 MED ORDER — HYDROCORTISONE 1 % EX CREA: 1.0000 "application " | TOPICAL_CREAM | Freq: Three times a day (TID) | CUTANEOUS | Status: DC | PRN

## 2019-02-04 MED ORDER — ACETAMINOPHEN 650 MG RE SUPP: 650.0000 mg | Freq: Four times a day (QID) | RECTAL | Status: DC | PRN

## 2019-02-04 MED ORDER — HYDROCORTISONE (PERIANAL) 2.5 % EX CREA: 1.0000 "application " | TOPICAL_CREAM | Freq: Four times a day (QID) | CUTANEOUS | Status: DC | PRN

## 2019-02-04 MED ORDER — BISMUTH SUBSALICYLATE 262 MG/15ML PO SUSP
30.0000 mL | Freq: Three times a day (TID) | ORAL | Status: DC | PRN
  Filled 2019-02-04: qty 236

## 2019-02-04 MED ORDER — METHOCARBAMOL 1000 MG/10ML IJ SOLN: 500.0000 mg | Freq: Four times a day (QID) | INTRAVENOUS | Status: DC | PRN

## 2019-02-04 MED ORDER — ACETAMINOPHEN 325 MG PO TABS
325.0000 mg | ORAL_TABLET | Freq: Four times a day (QID) | ORAL | Status: DC | PRN
  Filled 2019-02-04: qty 2

## 2019-02-04 MED ORDER — PANTOPRAZOLE SODIUM 40 MG PO TBEC
40.0000 mg | DELAYED_RELEASE_TABLET | Freq: Every day | ORAL | Status: DC
  Administered 2019-02-04 – 2019-02-05 (×2): 40 mg via ORAL
  Filled 2019-02-04 (×2): qty 1

## 2019-02-04 MED ORDER — ENSURE SURGERY PO LIQD
237.0000 mL | Freq: Two times a day (BID) | ORAL | Status: DC
  Administered 2019-02-04 – 2019-02-05 (×2): 237 mL via ORAL
  Filled 2019-02-04 (×5): qty 237

## 2019-02-04 MED ORDER — PHENOL 1.4 % MT LIQD: 1.0000 | OROMUCOSAL | Status: DC | PRN

## 2019-02-04 MED ORDER — MENTHOL 3 MG MT LOZG: 1.0000 | LOZENGE | OROMUCOSAL | Status: DC | PRN

## 2019-02-04 MED ORDER — MAGIC MOUTHWASH
15.0000 mL | Freq: Four times a day (QID) | ORAL | Status: DC | PRN
  Filled 2019-02-04: qty 15

## 2019-02-04 MED ORDER — METHOCARBAMOL 1000 MG/10ML IJ SOLN
500.0000 mg | Freq: Three times a day (TID) | INTRAVENOUS | Status: DC | PRN
  Filled 2019-02-04: qty 5

## 2019-02-04 MED ORDER — LACTATED RINGERS IV BOLUS: 1000.0000 mL | Freq: Three times a day (TID) | INTRAVENOUS | Status: AC | PRN

## 2019-02-04 MED ORDER — SUMATRIPTAN SUCCINATE 25 MG PO TABS
25.0000 mg | ORAL_TABLET | ORAL | Status: DC | PRN
  Filled 2019-02-04: qty 1

## 2019-02-04 MED ORDER — GUAIFENESIN-DM 100-10 MG/5ML PO SYRP: 10.0000 mL | ORAL_SOLUTION | ORAL | Status: DC | PRN

## 2019-02-04 MED ORDER — LIP MEDEX EX OINT: 1.0000 "application " | TOPICAL_OINTMENT | Freq: Two times a day (BID) | CUTANEOUS | Status: DC

## 2019-02-04 MED ORDER — TRAVASOL 10 % IV SOLN
INTRAVENOUS | Status: AC
  Filled 2019-02-04: qty 883.2

## 2019-02-04 NOTE — Progress Notes (Signed)
PHARMACY - TOTAL PARENTERAL NUTRITION CONSULT NOTE   Indication: prolonged ileus  Patient Measurements: Height: 5\' 4"  (162.6 cm) Weight: 149 lb 14.6 oz (68 kg) IBW/kg (Calculated) : 54.7 TPN AdjBW (KG): 68 Body mass index is 25.73 kg/m.  Assessment: Pt presented to ED on 12/9 with CC of abdominal pain. Underwent open ileocecectomy on 12/9.   Glucose / Insulin: BG 120-152 (goal 100-150), received 6 units of SSI/24 hrs Electrolytes: WNL including corrected Ca Renal: SCr/BUN - WNL, stable LFTs / TGs: WNL (AST 11/ALT 12 on 12/19) Prealbumin / albumin: 20.3 (12/18) GI Imaging:  12/16 CT Abdomen: ileus versus mechanical obstruction.  Surgeries / Procedures: Acute appendicitis with perforation Laparoscopic converted to open ileocecectomy for acute appendicitis perforation, 01/24/2019   Central access: 12/17 TPN start date: 12/17  Nutritional Goals (per RD recommendation: 12/17)  kCal: 1700-1900, Protein: 80-90g, Fluid: 1.9L/day  Goal TPN rate at 80 mL/hr (provides 88 g of protein and 1886 kcals per day)  Current Nutrition:  NPO except sips with meds/ice chips,   Plan:   Continue TPN at goal rate of 80 mL/hr 1800  Electrolytes in TPN: standard, no changes  36mEq/L of Na, 67mEq/L of K, 5mEq/L of Ca, 81mEq/L of Mg, and 59mmol/L of Phos. Cl:Ac ratio 1:1  Add standard MVI on MWF due to national backorder, trace elements daily  Continue sensitive SSI q6h  Continue maintenance fluids @ KVO  TPN labs on Mon/Thurs  Lenis Noon, PharmD 02/04/19 9:47 AM

## 2019-02-04 NOTE — Progress Notes (Signed)
Ruth Hubbard [id number] 1980-10-09  CARE TEAM:  PCP: Richardean Chimera, MD  Outpatient Care Team: Patient Care Team: Richardean Chimera, MD as PCP - General (Obstetrics and Gynecology)  Inpatient Treatment Team: Treatment Team: Attending Provider: Montez Morita, Md, MD; Technician: Vella Raring, NT; Technician: Elwin Mocha, NT   Problem List:   Principal Problem:   Acute appendicitis with perforation and abscess s/p ileocectomy 01/24/2019   11 Days Post-Op  01/24/2019  POST-OPERATIVE DIAGNOSIS:  Appendicitis  PROCEDURE:  ATTEMPTED LAPAROSCOPIC APPENDECTOMY OPEN ILEOCECECTOMY  SURGEON:  Griselda Miner, MD    Assessment  Ileus resolving  [facility] Stay = 12 days)  Plan:  -Guardedly optimistic with her many loose bowel movements and flatus that her ileus is finally resolving.  She tolerated sips of some liquids.  Do full clear liquid tray and advance to dysphagia 1/full liquid diet as tolerated this evening.  She would really like to try some thicker material.  I recommend she start with larger volume or clear liquids for at least lunch before advancing.  Try supplemental shakes.  -If tolerating by mouth better, consider start weaning off TNA tomorrow & the next few days.  -Most likely having post ileus diarrhea.  Usually resolves over 24/48 hours.  Add one low-dose daily Metamucil to help thicken up bowels.  Pepto as needed to slow down.  Hold off on antidiarrheals given prolonged ileus.  -Patient concerned about elevated white count.  I noted that the most recent result was 15, improvement of her peak of around 17.  Repeat WBC in the morning.  The fact that her ileus resolved, afebrile, pain less, etc, is encouraging.  Afebrile.  No hypertension or tachycardia.  No wound infection.  CT scan 4 days ago showing ileus but no major abscess hopefully encouraging sign.  Continue antibiotics for now.  Most likely can stop if tolerating solid diet with no recurrent ileus as long as  leukocytosis stable to resolving.  Hopefully in the next few days.  Hold off on repeat CT scan unless has recurrent ileus or worsening fevers/leukocytosis, especially of antibiotics.  Migraine/headache of uncertain etiology yesterday.  No visual or auditory aura.  No longer present will offer Imitrex as needed.  Acetaminophen as needed.  Hold off on NSAIDs for now  PPI for probable GERD and ileus.  Switch to p.o. since ileus seems to be resolving.  Pain control.  Ice/heat.  Robaxin IV q8H with extra for breakthrough.  Tylenol as needed.  Minimizing narcotics for now.  May be revisited as she mobilizes more  Most likely remove skin staples this coming week  VTE prophylaxis- SCDs, etc  Mobilize as tolerated to help recovery  30 minutes spent in review, evaluation, examination, counseling, and coordination of care.  More than 50% of that time was spent in counseling.  Patient got frustrated forward to me when I was trying to explain her big picture course and commented that I was being condescending.  I tried to apologize and explained the big picture how she has been through a rather horrible appendicitis with a prolonged hospital stay.  However the fact that her bowels are opening up is a hopeful sign that things are finally getting better.  That seem to somewhat help, but she was clearly annoyed with me at the end of the visit.  I again apologized .  Discussed with the floor nurse, Aurther Loft.  We will work to try and better address her concerns.  02/04/2019    Subjective: (Chief  complaint)  Patient had many watery bowel movements.  Almost 20 in the past 24 hours.  Passing gas.  Not nauseated.  She tried some clear liquids and even some tomato soup on the floor.  A few sips.  Seem to tolerate.  Did not like the taste of the tomato soup necessarily.  Hoping to have some grits.  Felt a headache almost like a migraine yesterday.  None now.  No vision or auditory changes.  No weakness.  No  lightheadedness or dizziness.  Objective:  Vital signs:  Vitals:   02/03/19 1409 02/03/19 2125 02/04/19 0132 02/04/19 0610  BP: 120/61 112/61 (!) 114/52 (!) 105/54  Pulse: 74 79 72 75  Resp: Temp: 99.1 F (37.3 C) 98.9 F (37.2 C) 99.8 F (37.7 C) 98.7 F (37.1 C)  TempSrc: Oral Oral Oral Oral  SpO2: 96% 98% 98% 100%  Weight:      Height:        Last BM Date: 02/03/19  Intake/Output   Yesterday:  12/19 0701 - 12/20 0700 In: 2760.8 [P.O.:120; I.V.:2139.5; IV Piggyback:501.4] Out: -  This shift:  No intake/output data recorded.  Bowel function:  Flatus: YES  BM:  YES  Drain: (No drain)   Physical Exam:  General: Pt awake/alert/oriented x4 in no acute distress Eyes: PERRL, normal EOM.  Sclera clear.  No icterus Neuro: CN II-XII intact w/o focal sensory/motor deficits. Lymph: No head/neck/groin lymphadenopathy Psych:  No delerium/psychosis/paranoia.  Very inquisitive with many appropriate questions. HENT: Normocephalic, Mucus membranes moist.  No thrush Neck: Supple, No tracheal deviation Chest: No chest wall pain w good excursion CV:  Pulses intact.  Regular rhythm MS: Normal AROM mjr joints.  No obvious deformity  Abdomen: Soft.  Nondistended.  Mildly tender at incisions only.  Midline incision with skin staples intact.  No cellulitis or abscess.  No evidence of peritonitis.  No incarcerated hernias.  Ext:  No deformity.  No mjr edema.  No cyanosis Skin: No petechiae / purpura  Results:   Cultures: Recent Results (from the past 720 hour(s))  Novel Coronavirus, NAA ([facility])     Status: None   Collection Time: 01/23/19  3:46 PM   Specimen: Nasopharyngeal(NP) swabs in vial transport medium   NASOPHARYNGE  Result Value Ref Range Status   SARS-CoV-2, NAA Not Detected Not Detected Final    Comment: This nucleic acid amplification test was developed and its performance characteristics determined by [employer]. Nucleic  acid amplification tests include PCR and TMA. This test has not been FDA cleared or approved. This test has been authorized by FDA under an Emergency Use Authorization (EUA). This test is only authorized for the duration of time the declaration that circumstances exist justifying the authorization of the emergency use of in vitro diagnostic tests for detection of SARS-CoV-2 virus and/or diagnosis of COVID-19 infection under section 564(b)(1) of the Act, 21 U.S.C. 161WRU-0(A) (1), unless the authorization is terminated or revoked sooner. When diagnostic testing is negative, the possibility of a false negative result should be considered in the context of a patient's recent exposures and the presence of clinical signs and symptoms consistent with COVID-19. An individual without symptoms of COVID-19 and who is not shedding SARS-CoV-2 virus would  expect to have a negative (not detected) result in this assay.   SARS CORONAVIRUS 2 (TAT 6-24 HRS) Nasopharyngeal Nasopharyngeal Swab     Status: None   Collection Time: 01/23/19 11:19 PM   Specimen: Nasopharyngeal Swab  Result Value Ref Range Status   SARS Coronavirus 2 NEGATIVE NEGATIVE Final    Comment: (NOTE) SARS-CoV-2 target nucleic acids are NOT DETECTED. The SARS-CoV-2 RNA is generally detectable in upper and lower respiratory specimens during the acute phase of infection. Negative results do not preclude SARS-CoV-2 infection, do not rule out co-infections with other pathogens, and should not be used as the sole basis for treatment or other patient management decisions. Negative results must be combined with clinical observations, patient history, and epidemiological information. The expected result is Negative. Fact Sheet for Patients: HairSlick.nohttps://www.fda.gov/media/138098/download Fact Sheet for Healthcare Providers: quierodirigir.comhttps://www.fda.gov/media/138095/download This test is not yet approved or cleared by the [place] FDA and  has  been authorized for detection and/or diagnosis of SARS-CoV-2 by FDA under an Emergency Use Authorization (EUA). This EUA will remain  in effect (meaning this test can be used) for the duration of the COVID-19 declaration under Section 56 4(b)(1) of the Act, 21 U.S.C. section 360bbb-3(b)(1), unless the authorization is terminated or revoked sooner. Performed at [facility], [address] [place], [place] [address]   Surgical PCR screen     Status: None   Collection Time: 01/24/19  2:31 AM   Specimen: Nasal Mucosa; Nasal Swab  Result Value Ref Range Status   MRSA, PCR NEGATIVE NEGATIVE Final   Staphylococcus aureus NEGATIVE NEGATIVE Final    Comment: (NOTE) The Xpert SA Assay (FDA approved for NASAL specimens in patients 38 years of age and older), is one component of a comprehensive surveillance program. It is not intended to diagnose infection nor to guide or monitor treatment. Performed at [facility], [address] [place], [place] [id number]   Respiratory Panel by RT PCR (Flu A&B, Covid) - Nasopharyngeal Swab     Status: None   Collection Time: 01/24/19  7:58 AM   Specimen: Nasopharyngeal Swab  Result Value Ref Range Status   SARS Coronavirus 2 by RT PCR NEGATIVE NEGATIVE Final    Comment: (NOTE) SARS-CoV-2 target nucleic acids are NOT DETECTED. The SARS-CoV-2 RNA is generally detectable in upper respiratoy specimens during the acute phase of infection. The lowest concentration of SARS-CoV-2 viral copies this assay can detect is 131 copies/mL. A negative result does not preclude SARS-Cov-2 infection and should not be used as the sole basis for treatment or other patient management decisions. A negative result may occur with  improper specimen collection/handling, submission of specimen other than nasopharyngeal swab, presence of viral mutation(s) within the areas targeted by this assay, and inadequate number of viral copies (<131 copies/mL). A  negative result must be combined with clinical observations, patient history, and epidemiological information. The expected result is Negative. Fact Sheet for Patients:  https://www.moore.com/https://www.fda.gov/media/142436/download Fact Sheet for Healthcare Providers:  https://www.young.biz/https://www.fda.gov/media/142435/download This test is not yet ap proved or cleared by the [place] FDA and  has been authorized for detection and/or diagnosis of SARS-CoV-2 by FDA under an Emergency Use Authorization (EUA). This EUA will remain  in effect (meaning this test can be used) for the duration of the COVID-19 declaration under Section 564(b)(1) of the Act, 21 U.S.C. section 360bbb-3(b)(1), unless the authorization is terminated or revoked sooner.    Influenza A by PCR NEGATIVE NEGATIVE Final   Influenza B by PCR NEGATIVE NEGATIVE Final    Comment: (NOTE) The Xpert Xpress SARS-CoV-2/FLU/RSV assay is intended as an aid in  the diagnosis of influenza from Nasopharyngeal swab specimens and  should not be used as a sole basis for  treatment. Nasal washings and  aspirates are unacceptable for Xpert Xpress SARS-CoV-2/FLU/RSV  testing. Fact Sheet for Patients: https://www.moore.com/ Fact Sheet for Healthcare Providers: https://www.young.biz/ This test is not yet approved or cleared by the [place] FDA and  has been authorized for detection and/or diagnosis of SARS-CoV-2 by  FDA under an Emergency Use Authorization (EUA). This EUA will remain  in effect (meaning this test can be used) for the duration of the  Covid-19 declaration under Section 564(b)(1) of the Act, 21  U.S.C. section 360bbb-3(b)(1), unless the authorization is  terminated or revoked. Performed at [facility], [address], [place], [place] [address]     Labs: Results for orders placed or performed during the hospital encounter of 01/23/19 (from the past 48 hour(s))  Glucose, capillary      Status: Abnormal   Collection Time: 02/02/19 12:14 PM  Result Value Ref Range   Glucose-Capillary 152 (H) 70 - 99 mg/dL  Glucose, capillary     Status: Abnormal   Collection Time: 02/02/19  4:27 PM  Result Value Ref Range   Glucose-Capillary 128 (H) 70 - 99 mg/dL  Glucose, capillary     Status: Abnormal   Collection Time: 02/02/19  8:09 PM  Result Value Ref Range   Glucose-Capillary 148 (H) 70 - 99 mg/dL  Glucose, capillary     Status: Abnormal   Collection Time: 02/03/19 12:15 AM  Result Value Ref Range   Glucose-Capillary 162 (H) 70 - 99 mg/dL  CBC     Status: Abnormal   Collection Time: 02/03/19  3:54 AM  Result Value Ref Range   WBC 13.9 (H) 4.0 - 10.5 K/uL    Comment: SPECIMEN CONTAMINATED, UNABLE TO PERFORM TEST(S). TPA RUNNING    RBC 3.59 (L) 3.87 - 5.11 MIL/uL    Comment: SPECIMEN CONTAMINATED, UNABLE TO PERFORM TEST(S).   Hemoglobin 11.1 (L) 12.0 - 15.0 g/dL    Comment: SPECIMEN CONTAMINATED, UNABLE TO PERFORM TEST(S).   HCT 35.5 (L) 36.0 - 46.0 %    Comment: SPECIMEN CONTAMINATED, UNABLE TO PERFORM TEST(S).   MCV 98.9 80.0 - 100.0 fL    Comment: DELTA CHECK NOTED SPECIMEN CONTAMINATED, UNABLE TO PERFORM TEST(S).    MCH 30.9 26.0 - 34.0 pg    Comment: SPECIMEN CONTAMINATED, UNABLE TO PERFORM TEST(S).   MCHC 31.3 30.0 - 36.0 g/dL    Comment: SPECIMEN CONTAMINATED, UNABLE TO PERFORM TEST(S).   RDW 17.1 (H) 11.5 - 15.5 %    Comment: SPECIMEN CONTAMINATED, UNABLE TO PERFORM TEST(S).   Platelets 382 150 - 400 K/uL    Comment: SPECIMEN CONTAMINATED, UNABLE TO PERFORM TEST(S).   nRBC 0.0 0.0 - 0.2 %    Comment: SPECIMEN CONTAMINATED, UNABLE TO PERFORM TEST(S). Performed at [facility], [address] [place], [place] [address]   Glucose, capillary     Status: Abnormal   Collection Time: 02/03/19  4:54 AM  Result Value Ref Range   Glucose-Capillary 150 (H) 70 - 99 mg/dL  Glucose, capillary     Status: Abnormal   Collection Time: 02/03/19  7:45  AM  Result Value Ref Range   Glucose-Capillary 135 (H) 70 - 99 mg/dL  Basic metabolic panel     Status: Abnormal   Collection Time: 02/03/19  9:47 AM  Result Value Ref Range   Sodium 136 135 - 145 mmol/L   Potassium 4.1 3.5 - 5.1 mmol/L   Chloride 107 98 - 111 mmol/L   CO2 22 22 - 32  mmol/L   Glucose, Bld 151 (H) 70 - 99 mg/dL   BUN 8 6 - 20 mg/dL   Creatinine, Ser 9.52 0.44 - 1.00 mg/dL   Calcium 8.4 (L) 8.9 - 10.3 mg/dL   GFR calc non Af Amer >60 >60 mL/min   GFR calc Af Amer >60 >60 mL/min   Anion gap 7 5 - 15    Comment: Performed at [facility], [address], [place], [place] [address]  CBC     Status: Abnormal   Collection Time: 02/03/19  9:47 AM  Result Value Ref Range   WBC 15.3 (H) 4.0 - 10.5 K/uL   RBC 3.82 (L) 3.87 - 5.11 MIL/uL   Hemoglobin 11.5 (L) 12.0 - 15.0 g/dL   HCT 44.0 (L) 10.2 - 72.5 %   MCV 90.3 80.0 - 100.0 fL    Comment: REPEATED TO VERIFY DELTA CHECK NOTED RECOLLECTED, PREV SAMPLE CONTAMINATED    MCH 30.1 26.0 - 34.0 pg   MCHC 33.3 30.0 - 36.0 g/dL   RDW 36.6 (H) 44.0 - 34.7 %   Platelets 399 150 - 400 K/uL   nRBC 0.0 0.0 - 0.2 %    Comment: Performed at [facility], [address] [place], [place] [address]  Magnesium     Status: None   Collection Time: 02/03/19  9:47 AM  Result Value Ref Range   Magnesium 1.8 1.7 - 2.4 mg/dL    Comment: Performed at [facility], [address] [place], [place] [address]  Phosphorus     Status: None   Collection Time: 02/03/19  9:47 AM  Result Value Ref Range   Phosphorus 2.9 2.5 - 4.6 mg/dL    Comment: Performed at [facility], [address], [place], [place] [address]  Triglycerides     Status: None   Collection Time: 02/03/19  9:47 AM  Result Value Ref Range   Triglycerides 81 <150 mg/dL    Comment: Performed at [facility], [address], [place], [place] [address]  Comprehensive metabolic panel      Status: Abnormal   Collection Time: 02/03/19  9:47 AM  Result Value Ref Range   Sodium 136 135 - 145 mmol/L   Potassium 4.1 3.5 - 5.1 mmol/L   Chloride 106 98 - 111 mmol/L   CO2 23 22 - 32 mmol/L   Glucose, Bld 152 (H) 70 - 99 mg/dL   BUN 9 6 - 20 mg/dL   Creatinine, Ser 4.16 0.44 - 1.00 mg/dL   Calcium 8.4 (L) 8.9 - 10.3 mg/dL   Total Protein 5.9 (L) 6.5 - 8.1 g/dL   Albumin 2.6 (L) 3.5 - 5.0 g/dL   AST 11 (L) 15 - 41 U/L   ALT 12 0 - 44 U/L   Alkaline Phosphatase 46 38 - 126 U/L   Total Bilirubin 0.2 (L) 0.3 - 1.2 mg/dL   GFR calc non Af Amer >60 >60 mL/min   GFR calc Af Amer >60 >60 mL/min   Anion gap 7 5 - 15    Comment: Performed at [facility], [address], [place], [place] [address]  Glucose, capillary     Status: Abnormal   Collection Time: 02/03/19 11:53 AM  Result Value Ref Range   Glucose-Capillary 152 (H) 70 - 99 mg/dL  Glucose, capillary     Status: Abnormal   Collection Time: 02/03/19  4:54 PM  Result Value Ref Range  Glucose-Capillary 123 (H) 70 - 99 mg/dL  Glucose, capillary     Status: Abnormal   Collection Time: 02/03/19 11:58 PM  Result Value Ref Range   Glucose-Capillary 122 (H) 70 - 99 mg/dL  Basic metabolic panel     Status: Abnormal   Collection Time: 02/04/19  3:57 AM  Result Value Ref Range   Sodium 137 135 - 145 mmol/L   Potassium 4.5 3.5 - 5.1 mmol/L   Chloride 108 98 - 111 mmol/L   CO2 22 22 - 32 mmol/L   Glucose, Bld 152 (H) 70 - 99 mg/dL   BUN 10 6 - 20 mg/dL   Creatinine, Ser 1.61 0.44 - 1.00 mg/dL   Calcium 8.6 (L) 8.9 - 10.3 mg/dL   GFR calc non Af Amer >60 >60 mL/min   GFR calc Af Amer >60 >60 mL/min   Anion gap 7 5 - 15    Comment: Performed at [facility], [address], [place], [place] [address]  Magnesium     Status: None   Collection Time: 02/04/19  3:57 AM  Result Value Ref Range   Magnesium 1.9 1.7 - 2.4 mg/dL    Comment: Performed at [facility], [address] [place], [place] [address]  Phosphorus     Status: None   Collection Time: 02/04/19  3:57 AM  Result Value Ref Range   Phosphorus 3.5 2.5 - 4.6 mg/dL    Comment: Performed at [facility], [address], [place], [place] [address]  Glucose, capillary     Status: Abnormal   Collection Time: 02/04/19  5:40 AM  Result Value Ref Range   Glucose-Capillary 151 (H) 70 - 99 mg/dL  Glucose, capillary     Status: Abnormal   Collection Time: 02/04/19  7:38 AM  Result Value Ref Range   Glucose-Capillary 120 (H) 70 - 99 mg/dL  Glucose, capillary     Status: Abnormal   Collection Time: 02/04/19 11:41 AM  Result Value Ref Range   Glucose-Capillary 143 (H) 70 - 99 mg/dL    Imaging / Studies: No results found.  Medications / Allergies: per chart  Antibiotics: Anti-infectives (From admission, onward)   Start     Dose/Rate Route Frequency Ordered Stop   01/24/19 2200  cefTRIAXone (ROCEPHIN) 2 g in sodium chloride 0.9 % 100 mL IVPB     2 g 200 mL/hr over 30 Minutes Intravenous Every 24 hours 01/24/19 0213     01/24/19 0600  metroNIDAZOLE (FLAGYL) IVPB 500 mg     500 mg 100 mL/hr over 60 Minutes Intravenous Every 8 hours 01/24/19 0213     01/23/19 2130  cefTRIAXone (ROCEPHIN) 1 g in sodium chloride 0.9 % 100 mL IVPB     1 g 200 mL/hr over 30 Minutes Intravenous  Once 01/23/19 2118 01/23/19 2216   01/23/19 2130  metroNIDAZOLE (FLAGYL) IVPB 500 mg     500 mg 100 mL/hr over 60 Minutes Intravenous  Once 01/23/19 2118 01/23/19 2333        Note: Portions of this report may have been transcribed using voice recognition software. Every effort was made to ensure accuracy; however, inadvertent computerized transcription errors may be present.   Any transcriptional errors that result from this process are unintentional.     Ardeth Sportsman, MD, FACS, MASCRS Gastrointestinal and Minimally Invasive Surgery    [address] [place], [place]  [phone] Main / Paging [phone]  Fax

## 2019-02-05 LAB — COMPREHENSIVE METABOLIC PANEL
ALT: 12 U/L (ref 0–44)
AST: 13 U/L — ABNORMAL LOW (ref 15–41)
Albumin: 2.9 g/dL — ABNORMAL LOW (ref 3.5–5.0)
Alkaline Phosphatase: 50 U/L (ref 38–126)
Anion gap: 8 (ref 5–15)
BUN: 11 mg/dL (ref 6–20)
CO2: 23 mmol/L (ref 22–32)
Calcium: 8.7 mg/dL — ABNORMAL LOW (ref 8.9–10.3)
Chloride: 105 mmol/L (ref 98–111)
Creatinine, Ser: 0.44 mg/dL (ref 0.44–1.00)
GFR calc Af Amer: >60 mL/min (ref >60)
GFR calc non Af Amer: >60 mL/min (ref >60)
Glucose, Bld: 139 mg/dL — ABNORMAL HIGH (ref 70–99)
Potassium: 4.4 mmol/L (ref 3.5–5.1)
Sodium: 136 mmol/L (ref 135–145)
Total Bilirubin: 0.7 mg/dL (ref 0.3–1.2)
Total Protein: 6 g/dL — ABNORMAL LOW (ref 6.5–8.1)

## 2019-02-05 LAB — DIFFERENTIAL
Abs Immature Granulocytes: 0.31 10*3/uL — ABNORMAL HIGH (ref 0.00–0.07)
Basophils Absolute: 0.1 10*3/uL (ref 0.0–0.1)
Basophils Relative: 1 %
Eosinophils Absolute: 0.2 10*3/uL (ref 0.0–0.5)
Eosinophils Relative: 2 %
Immature Granulocytes: 3 %
Lymphocytes Relative: 18 %
Lymphs Abs: 2.1 10*3/uL (ref 0.7–4.0)
Monocytes Absolute: 1.3 10*3/uL — ABNORMAL HIGH (ref 0.1–1.0)
Monocytes Relative: 11 %
Neutro Abs: 7.9 10*3/uL — ABNORMAL HIGH (ref 1.7–7.7)
Neutrophils Relative %: 65 %

## 2019-02-05 LAB — CBC
HCT: 33.7 % — ABNORMAL LOW (ref 36.0–46.0)
Hemoglobin: 11 g/dL — ABNORMAL LOW (ref 12.0–15.0)
MCH: 29.4 pg (ref 26.0–34.0)
MCHC: 32.6 g/dL (ref 30.0–36.0)
MCV: 90.1 fL (ref 80.0–100.0)
Platelets: 406 10*3/uL — ABNORMAL HIGH (ref 150–400)
RBC: 3.74 MIL/uL — ABNORMAL LOW (ref 3.87–5.11)
RDW: 15.9 % — ABNORMAL HIGH (ref 11.5–15.5)
WBC: 12 10*3/uL — ABNORMAL HIGH (ref 4.0–10.5)
nRBC: 0 % (ref 0.0–0.2)

## 2019-02-05 LAB — GLUCOSE, CAPILLARY
Glucose-Capillary: 129 mg/dL — ABNORMAL HIGH (ref 70–99)
Glucose-Capillary: 131 mg/dL — ABNORMAL HIGH (ref 70–99)
Glucose-Capillary: 137 mg/dL — ABNORMAL HIGH (ref 70–99)
Glucose-Capillary: 156 mg/dL — ABNORMAL HIGH (ref 70–99)
Glucose-Capillary: 159 mg/dL — ABNORMAL HIGH (ref 70–99)

## 2019-02-05 LAB — MAGNESIUM: Magnesium: 2 mg/dL (ref 1.7–2.4)

## 2019-02-05 LAB — PHOSPHORUS: Phosphorus: 3.3 mg/dL (ref 2.5–4.6)

## 2019-02-05 LAB — TRIGLYCERIDES: Triglycerides: 79 mg/dL (ref <150)

## 2019-02-05 LAB — PREALBUMIN: Prealbumin: 23.7 mg/dL (ref 18–38)

## 2019-02-05 MED ORDER — METHOCARBAMOL 500 MG PO TABS
500.0000 mg | ORAL_TABLET | Freq: Three times a day (TID) | ORAL | Status: DC | PRN
  Administered 2019-02-05: 500 mg via ORAL
  Filled 2019-02-05: qty 1

## 2019-02-05 MED ORDER — MORPHINE SULFATE (PF) 2 MG/ML IV SOLN: 1.0000 mg | INTRAVENOUS | Status: DC | PRN

## 2019-02-05 MED ORDER — TRAMADOL HCL 50 MG PO TABS: 50.0000 mg | ORAL_TABLET | Freq: Four times a day (QID) | ORAL | Status: DC | PRN

## 2019-02-05 NOTE — Progress Notes (Signed)
Patient ID: Ruth Hubbard, female   DOB: 03/30/1980, 38 y.o.   MRN: [id number]    12 Days Post-Op  Subjective: Patient seems to be feeling well today.  No nausea or vomiting.  Tried the DI diet yesterday and did not like it.  Still having BMs  Pain is fairly well controlled.  ROS: See above, otherwise other systems negative  Objective: Vital signs in last 24 hours: Temp:  [98 F (36.7 C)-99.7 F (37.6 C)] 98 F (36.7 C) (12/21 0553) Pulse Rate:  [79-85] 79 (12/21 0553) Resp:  [15-18] 15 (12/21 0553) BP: (110-124)/(48-62) 111/59 (12/21 0553) SpO2:  [96 %-100 %] 96 % (12/21 0553) Last BM Date: 02/04/19  Intake/Output from previous day: 12/20 0701 - 12/21 0700 In: 3186.9 [P.O.:460; I.V.:2156.6; IV Piggyback:570.3] Out: 350 [Urine:350] Intake/Output this shift: Total I/O In: 0  Out: 600 [Urine:600]  PE: Abd: soft, some BS, less distended, appropriately tender, staples in place and will DC today.  Lab Results:  Recent Labs    02/03/19 0947 02/05/19 0305  WBC 15.3* 12.0*  HGB 11.5* 11.0*  HCT 34.5* 33.7*  PLT 399 406*   BMET Recent Labs    02/04/19 0357 02/05/19 0305  NA 137 136  K 4.5 4.4  CL 108 105  CO2 22 23  GLUCOSE 152* 139*  BUN 10 11  CREATININE 0.49 0.44  CALCIUM 8.6* 8.7*   PT/INR No results for input(s): LABPROT, INR in the last 72 hours. CMP     Component Value Date/Time   NA 136 02/05/2019 0305   K 4.4 02/05/2019 0305   CL 105 02/05/2019 0305   CO2 23 02/05/2019 0305   GLUCOSE 139 (H) 02/05/2019 0305   BUN 11 02/05/2019 0305   CREATININE 0.44 02/05/2019 0305   CALCIUM 8.7 (L) 02/05/2019 0305   PROT 6.0 (L) 02/05/2019 0305   ALBUMIN 2.9 (L) 02/05/2019 0305   AST 13 (L) 02/05/2019 0305   ALT 12 02/05/2019 0305   ALKPHOS 50 02/05/2019 0305   BILITOT 0.7 02/05/2019 0305   GFRNONAA >60 02/05/2019 0305   GFRAA >60 02/05/2019 0305   Lipase     Component Value Date/Time   LIPASE 40 01/23/2019 1808       Studies/Results: No results  found.  Anti-infectives: Anti-infectives (From admission, onward)   Start     Dose/Rate Route Frequency Ordered Stop   01/24/19 2200  cefTRIAXone (ROCEPHIN) 2 g in sodium chloride 0.9 % 100 mL IVPB  Status:  Discontinued     2 g 200 mL/hr over 30 Minutes Intravenous Every 24 hours 01/24/19 0213 02/05/19 1003   01/24/19 0600  metroNIDAZOLE (FLAGYL) IVPB 500 mg  Status:  Discontinued     500 mg 100 mL/hr over 60 Minutes Intravenous Every 8 hours 01/24/19 0213 02/05/19 1003   01/23/19 2130  cefTRIAXone (ROCEPHIN) 1 g in sodium chloride 0.9 % 100 mL IVPB     1 g 200 mL/hr over 30 Minutes Intravenous  Once 01/23/19 2118 01/23/19 2216   01/23/19 2130  metroNIDAZOLE (FLAGYL) IVPB 500 mg     500 mg 100 mL/hr over 60 Minutes Intravenous  Once 01/23/19 2118 01/23/19 2333       Assessment/Plan POD12, s/p lap converted to open ileocecectomy for acute perforated appendicitis -DC TNA today and adv to regular diet.  Patient requests a boiled egg and grits. -DC abx therapy this am.  No evidence of infectious source.  WBC down to 12K. -mobilize -pulm toilet  FEN:DC TNA today,  regular diet VTE: Lovenox ID: Rocephin/Flagyl  DC 12/21 Follow up: Carolynne Edouard   LOS: 13 days    Letha Cape , [facility] 02/05/2019, 10:09 AM Please see Amion for pager number during day hours 7:00am-4:30pm

## 2019-02-05 NOTE — Progress Notes (Signed)
PHARMACY - TOTAL PARENTERAL NUTRITION CONSULT NOTE   Indication: prolonged ileus  Patient Measurements: Height: 5\' 4"  (162.6 cm) Weight: 149 lb 14.6 oz (68 kg) IBW/kg (Calculated) : 54.7 TPN AdjBW (KG): 68 Body mass index is 25.73 kg/m.  Assessment: Pt presented to ED on 12/9 with c/o of abdominal pain and was found to have appendicitis. She underwent lap appendectomy and open ileocecectomy on 12/9. TPN was started on 12/17 for prolonged ileus.  Glucose / Insulin: cbgs wnl (goal 100-150) - on sensitive SSI q6h (received 6 units of SSI/24 hrs) Electrolytes: wnl Renal: scr stable LFTs / TGs: -  LFTs wnl - TG wnl Prealbumin / albumin: 20.3 (12/18), 23.7 (12/21) GI Imaging:  - 12/8 Abd CT: Findings are consistent with acute uncomplicated appendicitis - 12/13 abd x-ray: findings with concern for ileus - 12/16 CT Abdomen: ileus versus mechanical obstruction.  Surgeries / Procedures: Acute appendicitis with perforation Laparoscopic converted to open ileocecectomy for acute appendicitis perforation, 01/24/2019   Central access: PICC on 12/17 TPN start date: 12/17  Nutritional Goals (per RD recommendation: 12/17) Kcal:  1700-1900 Protein:  80-90g Fluid:  1.9L/day  Goal TPN rate at 80 mL/hr (provides 88 g of protein and 1886 kcals per day)  Current Nutrition:  - NPO except sips with meds/ice chips,   Plan:  - Per [facility], d/c TPN after current bag runs out on 12/21 - Will reduce TPN rate by half at 4 PM and continue at this rate for 2 hours, then d/c TPN at [place] will sign off. Re-consult [place] if need further assistance   Lynelle Doctor, PharmD 02/05/19 7:15 AM

## 2019-02-06 LAB — CBC
HCT: 33.8 % — ABNORMAL LOW (ref 36.0–46.0)
Hemoglobin: 11.1 g/dL — ABNORMAL LOW (ref 12.0–15.0)
MCH: 29.5 pg (ref 26.0–34.0)
MCHC: 32.8 g/dL (ref 30.0–36.0)
MCV: 89.9 fL (ref 80.0–100.0)
Platelets: 388 10*3/uL (ref 150–400)
RBC: 3.76 MIL/uL — ABNORMAL LOW (ref 3.87–5.11)
RDW: 16.2 % — ABNORMAL HIGH (ref 11.5–15.5)
WBC: 12.6 10*3/uL — ABNORMAL HIGH (ref 4.0–10.5)
nRBC: 0 % (ref 0.0–0.2)

## 2019-02-06 MED ORDER — ACETAMINOPHEN 325 MG PO TABS: 325.0000 mg | ORAL_TABLET | Freq: Four times a day (QID) | ORAL | Status: AC | PRN

## 2019-02-06 NOTE — Discharge Summary (Signed)
Physician Discharge Summary  Patient ID: Ruth Hubbard MRN: [id number] DOB/AGE: 1980/09/17 37 y.o.  Admit date: 01/23/2019 Discharge date: 02/06/2019  Admission Diagnoses:  Acute appendicitis  Discharge Diagnoses:  Acute appendicitis with perforation Postop ileus Mild malnutrition -postop  Principal Problem:   Acute appendicitis with perforation and abscess s/p ileocectomy 01/24/2019   PROCEDURES: Laparoscopic converted to open ileocecectomy for acute appendicitis with perforation, 01/24/2019 Dr. Lorine Bears III  Hospital Course:  Patient is an otherwise healthy 38 year old female who presented to the emergency department with complaints of abdominal pain.  Patient states pain started roughly 10 days ago in her right lower back and lower abdomen.  Pain was mild but intermittently severe.  Pain actually resolved roughly 3 days ago but returned.  Pain is now in her RLQ, severe, constant, nonradiating, movement makes it worse, pain medicine given here helps her pain.  No associated symptoms.  No history of abdominal surgeries.  No anticoagulation.  She was seen in the emergency department by Dr. Autumn Messing, admitted and taken to the operating room that day.  The appendix and cecum were very densely stuck to the pelvic sidewall.  As this was peeled back they found a walled off abscess cavity.  The appendix appeared to be perforated where it joined the cecum and the entire area was woody and hard with chronic inflammation.  At this point the laparoscopic procedure was aborted and she underwent an open ileocecectomy.  She tolerated the procedure well but developed a significant postoperative ileus.  She was kept on IV antibiotics, IV fluid hydration, and bowel rest.  We eventually placed her on TNA but because of her prolonged ileus.  With additional bowel rest her bowel function slowly returned.  Her diet was advanced.  TNA was weaned off.  Her incisions are healing nicely staples have been removed.  Area  strips were applied.  By 02/06/2019 she was tolerating a regular diet, and having bowel movements.  She was taking no narcotics for pain, and was ready for discharge.  Antibiotics were discontinued on 02/05/2019.  Condition on discharge: Improved  CBC Latest Ref Rng & Units 02/06/2019 02/05/2019 02/03/2019  WBC 4.0 - 10.5 K/uL 12.6(H) 12.0(H) 15.3(H)  Hemoglobin 12.0 - 15.0 g/dL 11.1(L) 11.0(L) 11.5(L)  Hematocrit 36.0 - 46.0 % 33.8(L) 33.7(L) 34.5(L)  Platelets 150 - 400 K/uL 388 406(H) 399   CMP Latest Ref Rng & Units 02/05/2019 02/04/2019 02/03/2019  Glucose 70 - 99 mg/dL 139(H) 152(H) 151(H)  BUN 6 - 20 mg/dL 11 10 8   Creatinine 0.44 - 1.00 mg/dL 0.44 0.49 0.45  Sodium 135 - 145 mmol/L 136 137 136  Potassium 3.5 - 5.1 mmol/L 4.4 4.5 4.1  Chloride 98 - 111 mmol/L 105 108 107  CO2 22 - 32 mmol/L 23 22 22   Calcium 8.9 - 10.3 mg/dL 8.7(L) 8.6(L) 8.4(L)  Total Protein 6.5 - 8.1 g/dL 6.0(L) - -  Total Bilirubin 0.3 - 1.2 mg/dL 0.7 - -  Alkaline Phos 38 - 126 U/L 50 - -  AST 15 - 41 U/L 13(L) - -  ALT 0 - 44 U/L 12 - -    Covid was negative on 01/24/2019  Pathology: SURGICAL PATHOLOGY  CASE: [id number]  PATIENT: Ruth Hubbard  Surgical Pathology Report  Clinical History: Appendicitis (cm)  FINAL MICROSCOPIC DIAGNOSIS:  A. COLON, CECUM INCLUDING APPENDIX AND PORTION OF TERMINAL ILEUM,  HEMICOLECTOMY:  - Acute appendicitis with abscess, features of perforation and  serositis  - Segment of colon with serositis  -  Terminal ileum with serositis  - Benign lymph nodes (0/4)  - No malignancy identified   Respiratory panel 12/9: Negative for Covid, influenza A and influenza B.   Disposition: Discharge disposition: 01-Home or Self Care        Allergies as of 02/06/2019   No Known Allergies     Medication List    TAKE these medications   acetaminophen 325 MG tablet Commonly known as: TYLENOL Take 1-2 tablets (325-650 mg total) by mouth every 6 (six) hours as  needed for mild pain, fever or headache.   ascorbic acid 250 MG Chew Commonly known as: VITAMIN C Chew 250 mg by mouth daily.   Hair Skin & Nails Gummies 1250-7.5-7.5 MCG-MG-UNT Chew Generic drug: Biotin w/ Vitamins C & E Chew 1 tablet by mouth daily.   Lumify 0.025 % Soln Generic drug: Brimonidine Tartrate Place 1 drop into both eyes daily.   VITAMIN B-12 ER PO Take 1 tablet by mouth daily.   VITAMIN E PO Take 1 capsule by mouth daily.      Follow-up Information    Chevis Pretty III, MD Follow up on 02/21/2019.   Specialty: General Surgery Why: Your appointment is at 10:00.  Be at the office 30 minutes early for check in, bring photo ID and insurance information.  Call for any issues if needed before your appointment. Contact information: [address] [place] [address] [phone]        Richardean Chimera, MD Follow up.   Specialty: Obstetrics and Gynecology Why: Call and let them know you had surgery and follow up for medical issues. Contact information: [address] [place] [address] [phone]           Signed: Sherrie George 02/06/2019, 8:17 AM

## 2019-02-06 NOTE — Progress Notes (Signed)
Discharge instructions discussed with patient, verbalized agreement and understanding, patient refused ordered flu vaccine, stated that she would feel more comfortable getting it from her primary physician after christmas

## 2019-02-06 NOTE — Progress Notes (Signed)
13 Days Post-Op    CC: Abdominal pain  Subjective: Tolerated diet.  Good bowel movements yesterday, and a good bowel movement this a.m.  Her incisions look fine.  Abdomen soft nondistended.  She feels she is ready for discharge.  Objective: Vital signs in last 24 hours: Temp:  [98.7 F (37.1 C)-99.5 F (37.5 C)] 98.7 F (37.1 C) (12/22 0618) Pulse Rate:  [81-88] 85 (12/22 0618) Resp:  [16-17] 17 (12/22 0618) BP: (106-110)/(53-55) 110/53 (12/22 0618) SpO2:  [95 %-100 %] 98 % (12/22 0618) Last BM Date: 02/04/19 540 p.o. recorded 1209 IV 1850 urine Afebrile vital signs are stable Stool x2 WBC 12.6 H/H is stable  Intake/Output from previous day: 12/21 0701 - 12/22 0700 In: 1749.3 [P.O.:540; I.V.:1209.3] Out: 1850 [Urine:1850] Intake/Output this shift: No intake/output data recorded.  General appearance: alert, cooperative and no distress Resp: clear to auscultation bilaterally GI: Soft, she is not taking anything for pain.  Positive bowel sounds tolerating regular diet and having bowel movements.  Lab Results:  Recent Labs    02/05/19 0305 02/06/19 0321  WBC 12.0* 12.6*  HGB 11.0* 11.1*  HCT 33.7* 33.8*  PLT 406* 388    BMET Recent Labs    02/04/19 0357 02/05/19 0305  NA 137 136  K 4.5 4.4  CL 108 105  CO2 22 23  GLUCOSE 152* 139*  BUN 10 11  CREATININE 0.49 0.44  CALCIUM 8.6* 8.7*   PT/INR No results for input(s): LABPROT, INR in the last 72 hours.  Recent Labs  Lab 02/02/19 0505 02/03/19 0947 02/05/19 0305  AST 18 11* 13*  ALT 15 12 12   ALKPHOS 58 46 50  BILITOT 0.7 0.2* 0.7  PROT 6.4* 5.9* 6.0*  ALBUMIN 3.0* 2.6* 2.9*     Lipase     Component Value Date/Time   LIPASE 40 01/23/2019 1808     Medications: . Chlorhexidine Gluconate Cloth  6 each Topical Daily  . enoxaparin (LOVENOX) injection  40 mg Subcutaneous Q24H  . feeding supplement  237 mL Oral BID BM  . influenza vac split quadrivalent PF  0.5 mL Intramuscular Tomorrow-1000   . pantoprazole  40 mg Oral Q1200  . psyllium  1 packet Oral Daily  . sodium chloride flush  10-40 mL Intracatheter Q12H   . dextrose 5 % and 0.45% NaCl 10 mL/hr at 02/05/19 1800  . lactated ringers      Assessment/Plan Postop ileus Malnutrition mild  Acute appendicitis with perforation Laparoscopic converted to open ileocecectomy for acute appendicitis perforation, 01/24/2019 Dr. Mamie Nick. Toth III POD # 13  FEN: regular diet ID: Rocephin/Flagyl 12/8 - 02/05/19 DVT: Lovenox Follow-up: Dr. Marlou Starks   Plan: Discharge home today after PICC line removed.  She is fine with just Tylenol for pain.    LOS: 14 days    Ruth Hubbard 02/06/2019 Please see Amion

## 2019-02-09 LAB — GLUCOSE, CAPILLARY: Glucose-Capillary: 106 mg/dL — ABNORMAL HIGH (ref 70–99)

## 2019-02-12 ENCOUNTER — Encounter (HOSPITAL_COMMUNITY): Payer: Self-pay | Admitting: *Deleted

## 2019-02-12 ENCOUNTER — Inpatient Hospital Stay (HOSPITAL_COMMUNITY)
Admission: EM | Admit: 2019-02-12 | Discharge: 2019-02-17 | DRG: 389 | Disposition: A | Discharge status: Home / Self Care | Payer: BC Managed Care – PPO | Attending: General Surgery | Admitting: General Surgery

## 2019-02-12 ENCOUNTER — Emergency Department (HOSPITAL_COMMUNITY): Payer: BC Managed Care – PPO

## 2019-02-12 DIAGNOSIS — Z833 Family history of diabetes mellitus: Secondary | ICD-10-CM

## 2019-02-12 DIAGNOSIS — K56609 Unspecified intestinal obstruction, unspecified as to partial versus complete obstruction: Secondary | ICD-10-CM | POA: Diagnosis present

## 2019-02-12 DIAGNOSIS — D649 Anemia, unspecified: Secondary | ICD-10-CM | POA: Diagnosis present

## 2019-02-12 DIAGNOSIS — Z6825 Body mass index (BMI) 25.0-25.9, adult: Secondary | ICD-10-CM | POA: Diagnosis not present

## 2019-02-12 DIAGNOSIS — Z8249 Family history of ischemic heart disease and other diseases of the circulatory system: Secondary | ICD-10-CM | POA: Diagnosis not present

## 2019-02-12 DIAGNOSIS — K913 Postprocedural intestinal obstruction, unspecified as to partial versus complete: Secondary | ICD-10-CM | POA: Diagnosis not present

## 2019-02-12 DIAGNOSIS — N179 Acute kidney failure, unspecified: Secondary | ICD-10-CM | POA: Diagnosis present

## 2019-02-12 DIAGNOSIS — K9189 Other postprocedural complications and disorders of digestive system: Secondary | ICD-10-CM | POA: Diagnosis present

## 2019-02-12 DIAGNOSIS — F17200 Nicotine dependence, unspecified, uncomplicated: Secondary | ICD-10-CM | POA: Diagnosis present

## 2019-02-12 DIAGNOSIS — K219 Gastro-esophageal reflux disease without esophagitis: Secondary | ICD-10-CM | POA: Diagnosis present

## 2019-02-12 DIAGNOSIS — E860 Dehydration: Secondary | ICD-10-CM | POA: Diagnosis present

## 2019-02-12 DIAGNOSIS — E46 Unspecified protein-calorie malnutrition: Secondary | ICD-10-CM | POA: Diagnosis present

## 2019-02-12 DIAGNOSIS — Z823 Family history of stroke: Secondary | ICD-10-CM | POA: Diagnosis not present

## 2019-02-12 DIAGNOSIS — Z20822 Contact with and (suspected) exposure to covid-19: Secondary | ICD-10-CM | POA: Diagnosis present

## 2019-02-12 DIAGNOSIS — R109 Unspecified abdominal pain: Secondary | ICD-10-CM

## 2019-02-12 LAB — CBC WITH DIFFERENTIAL/PLATELET
Abs Immature Granulocytes: 0.16 10*3/uL — ABNORMAL HIGH (ref 0.00–0.07)
Basophils Absolute: 0.1 10*3/uL (ref 0.0–0.1)
Basophils Relative: 0 %
Eosinophils Absolute: 0.2 10*3/uL (ref 0.0–0.5)
Eosinophils Relative: 1 %
HCT: 44.8 % (ref 36.0–46.0)
Hemoglobin: 15.2 g/dL — ABNORMAL HIGH (ref 12.0–15.0)
Immature Granulocytes: 1 %
Lymphocytes Relative: 13 %
Lymphs Abs: 2.1 10*3/uL (ref 0.7–4.0)
MCH: 29.3 pg (ref 26.0–34.0)
MCHC: 33.9 g/dL (ref 30.0–36.0)
MCV: 86.5 fL (ref 80.0–100.0)
Monocytes Absolute: 1.8 10*3/uL — ABNORMAL HIGH (ref 0.1–1.0)
Monocytes Relative: 11 %
Neutro Abs: 11.5 10*3/uL — ABNORMAL HIGH (ref 1.7–7.7)
Neutrophils Relative %: 74 %
Platelets: 445 10*3/uL — ABNORMAL HIGH (ref 150–400)
RBC: 5.18 MIL/uL — ABNORMAL HIGH (ref 3.87–5.11)
RDW: 14.6 % (ref 11.5–15.5)
WBC: 15.7 10*3/uL — ABNORMAL HIGH (ref 4.0–10.5)
nRBC: 0 % (ref 0.0–0.2)

## 2019-02-12 LAB — COMPREHENSIVE METABOLIC PANEL
ALT: 18 U/L (ref 0–44)
AST: 22 U/L (ref 15–41)
Albumin: 4.5 g/dL (ref 3.5–5.0)
Alkaline Phosphatase: 52 U/L (ref 38–126)
Anion gap: 20 — ABNORMAL HIGH (ref 5–15)
BUN: 27 mg/dL — ABNORMAL HIGH (ref 6–20)
CO2: 27 mmol/L (ref 22–32)
Calcium: 10 mg/dL (ref 8.9–10.3)
Chloride: 84 mmol/L — ABNORMAL LOW (ref 98–111)
Creatinine, Ser: 1.34 mg/dL — ABNORMAL HIGH (ref 0.44–1.00)
GFR calc Af Amer: 58 mL/min — ABNORMAL LOW (ref >60)
GFR calc non Af Amer: 50 mL/min — ABNORMAL LOW (ref >60)
Glucose, Bld: 98 mg/dL (ref 70–99)
Potassium: 3.7 mmol/L (ref 3.5–5.1)
Sodium: 131 mmol/L — ABNORMAL LOW (ref 135–145)
Total Bilirubin: 1.2 mg/dL (ref 0.3–1.2)
Total Protein: 8.3 g/dL — ABNORMAL HIGH (ref 6.5–8.1)

## 2019-02-12 LAB — URINALYSIS, ROUTINE W REFLEX MICROSCOPIC
Bacteria, UA: NONE SEEN
Bilirubin Urine: NEGATIVE
Glucose, UA: NEGATIVE mg/dL
Hgb urine dipstick: NEGATIVE
Ketones, ur: 5 mg/dL — AB
Leukocytes,Ua: NEGATIVE
Nitrite: NEGATIVE
Protein, ur: 30 mg/dL — AB
Specific Gravity, Urine: 1.034 — ABNORMAL HIGH (ref 1.005–1.030)
pH: 5 (ref 5.0–8.0)

## 2019-02-12 LAB — I-STAT BETA HCG BLOOD, ED (MC, WL, AP ONLY): I-stat hCG, quantitative: <5 m[IU]/mL (ref <5)

## 2019-02-12 LAB — LIPASE, BLOOD: Lipase: 22 U/L (ref 11–51)

## 2019-02-12 LAB — SARS CORONAVIRUS 2 (TAT 6-24 HRS): SARS Coronavirus 2: NEGATIVE

## 2019-02-12 MED ORDER — IOHEXOL 300 MG/ML  SOLN
80.0000 mL | Freq: Once | INTRAMUSCULAR | Status: AC | PRN
  Administered 2019-02-12: 80 mL via INTRAVENOUS

## 2019-02-12 MED ORDER — ACETAMINOPHEN 500 MG PO TABS
1000.0000 mg | ORAL_TABLET | Freq: Four times a day (QID) | ORAL | Status: DC | PRN
  Administered 2019-02-12 – 2019-02-13 (×3): 1000 mg via ORAL
  Filled 2019-02-12 (×4): qty 2

## 2019-02-12 MED ORDER — ONDANSETRON HCL 4 MG/2ML IJ SOLN
4.0000 mg | Freq: Four times a day (QID) | INTRAMUSCULAR | Status: DC | PRN
  Administered 2019-02-16: 20:00:00 4 mg via INTRAVENOUS
  Filled 2019-02-12: qty 2

## 2019-02-12 MED ORDER — KCL-LACTATED RINGERS-D5W 20 MEQ/L IV SOLN
INTRAVENOUS | Status: DC
  Filled 2019-02-12 (×10): qty 1000

## 2019-02-12 MED ORDER — MORPHINE SULFATE (PF) 2 MG/ML IV SOLN: 1.0000 mg | INTRAVENOUS | Status: DC | PRN

## 2019-02-12 MED ORDER — DIPHENHYDRAMINE HCL 50 MG/ML IJ SOLN: 25.0000 mg | Freq: Four times a day (QID) | INTRAMUSCULAR | Status: DC | PRN

## 2019-02-12 MED ORDER — SODIUM CHLORIDE 0.9 % IV BOLUS
1000.0000 mL | Freq: Once | INTRAVENOUS | Status: AC
  Administered 2019-02-12: 1000 mL via INTRAVENOUS

## 2019-02-12 MED ORDER — ONDANSETRON HCL 4 MG/2ML IJ SOLN
4.0000 mg | Freq: Once | INTRAMUSCULAR | Status: AC
  Administered 2019-02-12: 17:00:00 4 mg via INTRAVENOUS
  Filled 2019-02-12: qty 2

## 2019-02-12 MED ORDER — HEPARIN SODIUM (PORCINE) 5000 UNIT/ML IJ SOLN
5000.0000 [IU] | Freq: Three times a day (TID) | INTRAMUSCULAR | Status: DC
  Administered 2019-02-12 – 2019-02-17 (×13): 5000 [IU] via SUBCUTANEOUS
  Filled 2019-02-12 (×13): qty 1

## 2019-02-12 MED ORDER — ONDANSETRON HCL 4 MG/2ML IJ SOLN
4.0000 mg | Freq: Once | INTRAMUSCULAR | Status: AC
  Administered 2019-02-12: 4 mg via INTRAVENOUS
  Filled 2019-02-12: qty 2

## 2019-02-12 MED ORDER — MORPHINE SULFATE (PF) 4 MG/ML IV SOLN
4.0000 mg | Freq: Once | INTRAVENOUS | Status: AC
  Administered 2019-02-12: 4 mg via INTRAVENOUS
  Filled 2019-02-12: qty 1

## 2019-02-12 MED ORDER — LORAZEPAM 2 MG/ML IJ SOLN
0.5000 mg | Freq: Four times a day (QID) | INTRAMUSCULAR | Status: DC | PRN
  Administered 2019-02-12 – 2019-02-17 (×10): 0.5 mg via INTRAVENOUS
  Filled 2019-02-12 (×10): qty 1

## 2019-02-12 MED ORDER — ONDANSETRON 4 MG PO TBDP: 4.0000 mg | ORAL_TABLET | Freq: Four times a day (QID) | ORAL | Status: DC | PRN

## 2019-02-12 MED ORDER — DIPHENHYDRAMINE HCL 25 MG PO CAPS: 25.0000 mg | ORAL_CAPSULE | Freq: Four times a day (QID) | ORAL | Status: DC | PRN

## 2019-02-12 NOTE — ED Provider Notes (Signed)
Care assumed at shift change from [place], [place], pending CT abd/pelvic. See his note for full HPI and workup. Briefly, pt presenting with recent Ruptured appendicitis 1 mo ago. Admitted after, needed abscess drained. Ileus during hospitalization. Better after d/c however new abd distension, N/V. ON exam today, Distended abd, suspicious for SBO or other complication.  Dr. Marlou Starks performed surgery. No fever. Very small BM this morning. Plan to follow CT, consult surgery. Likely admit for AKI, etc. Physical Exam  BP 136/66   Pulse 72   Temp 99.2 F (37.3 C) (Oral)   Resp 13   Wt 66.7 kg   SpO2 94%   BMI 25.23 kg/m   Physical Exam Vitals and nursing note reviewed.  Constitutional:      General: She is not in acute distress.    Appearance: She is well-developed. She is ill-appearing.     Comments: Dry-heaving  HENT:     Head: Normocephalic and atraumatic.  Eyes:     Conjunctiva/sclera: Conjunctivae normal.  Cardiovascular:     Rate and Rhythm: Normal rate and regular rhythm.  Pulmonary:     Effort: Pulmonary effort is normal.     Breath sounds: Normal breath sounds.  Abdominal:     General: There is distension.     Palpations: Abdomen is soft.     Tenderness: There is abdominal tenderness.     Comments: Well-healing surgical scars. BS high-pitched  Skin:    General: Skin is warm.  Neurological:     Mental Status: She is alert.  Psychiatric:        Behavior: Behavior normal.    Results for orders placed or performed during the hospital encounter of 02/12/19  Comprehensive metabolic panel  Result Value Ref Range   Sodium 131 (L) 135 - 145 mmol/L   Potassium 3.7 3.5 - 5.1 mmol/L   Chloride 84 (L) 98 - 111 mmol/L   CO2 27 22 - 32 mmol/L   Glucose, Bld 98 70 - 99 mg/dL   BUN 27 (H) 6 - 20 mg/dL   Creatinine, Ser 1.34 (H) 0.44 - 1.00 mg/dL   Calcium 10.0 8.9 - 10.3 mg/dL   Total Protein 8.3 (H) 6.5 - 8.1 g/dL   Albumin 4.5 3.5 - 5.0 g/dL   AST 22 15 - 41 U/L   ALT 18 0 - 44 U/L    Alkaline Phosphatase 52 38 - 126 U/L   Total Bilirubin 1.2 0.3 - 1.2 mg/dL   GFR calc non Af Amer 50 (L) >60 mL/min   GFR calc Af Amer 58 (L) >60 mL/min   Anion gap 20 (H) 5 - 15  Lipase, blood  Result Value Ref Range   Lipase 22 11 - 51 U/L  CBC with Diff  Result Value Ref Range   WBC 15.7 (H) 4.0 - 10.5 K/uL   RBC 5.18 (H) 3.87 - 5.11 MIL/uL   Hemoglobin 15.2 (H) 12.0 - 15.0 g/dL   HCT 44.8 36.0 - 46.0 %   MCV 86.5 80.0 - 100.0 fL   MCH 29.3 26.0 - 34.0 pg   MCHC 33.9 30.0 - 36.0 g/dL   RDW 14.6 11.5 - 15.5 %   Platelets 445 (H) 150 - 400 K/uL   nRBC 0.0 0.0 - 0.2 %   Neutrophils Relative % 74 %   Neutro Abs 11.5 (H) 1.7 - 7.7 K/uL   Lymphocytes Relative 13 %   Lymphs Abs 2.1 0.7 - 4.0 K/uL   Monocytes Relative 11 %  Monocytes Absolute 1.8 (H) 0.1 - 1.0 K/uL   Eosinophils Relative 1 %   Eosinophils Absolute 0.2 0.0 - 0.5 K/uL   Basophils Relative 0 %   Basophils Absolute 0.1 0.0 - 0.1 K/uL   Immature Granulocytes 1 %   Abs Immature Granulocytes 0.16 (H) 0.00 - 0.07 K/uL  Urinalysis, Routine w reflex microscopic  Result Value Ref Range   Color, Urine AMBER (A) YELLOW   APPearance CLOUDY (A) CLEAR   Specific Gravity, Urine 1.034 (H) 1.005 - 1.030   pH 5.0 5.0 - 8.0   Glucose, UA NEGATIVE NEGATIVE mg/dL   Hgb urine dipstick NEGATIVE NEGATIVE   Bilirubin Urine NEGATIVE NEGATIVE   Ketones, ur 5 (A) NEGATIVE mg/dL   Protein, ur 30 (A) NEGATIVE mg/dL   Nitrite NEGATIVE NEGATIVE   Leukocytes,Ua NEGATIVE NEGATIVE   RBC / HPF 0-5 0 - 5 RBC/hpf   WBC, UA 0-5 0 - 5 WBC/hpf   Bacteria, UA NONE SEEN NONE SEEN   Squamous Epithelial / LPF 0-5 0 - 5   Hyaline Casts, UA PRESENT   I-Stat beta hCG blood, ED  Result Value Ref Range   I-stat hCG, quantitative <5.0 <5 mIU/mL   Comment 3           CT ABDOMEN PELVIS W CONTRAST  Result Date: 02/12/2019 CLINICAL DATA:  Postop pain. Status post recent acute appendicitis. EXAM: CT ABDOMEN AND PELVIS WITH CONTRAST TECHNIQUE:  Multidetector CT imaging of the abdomen and pelvis was performed using the standard protocol following bolus administration of intravenous contrast. CONTRAST:  100 cc of Omnipaque 350. COMPARISON:  January 31, 2019 FINDINGS: Lower chest: The lung bases are clear. The heart size is normal. Hepatobiliary: The liver is normal. There is hyperdense material in the gallbladder lumen which may represent gallbladder sludge or stones.There is no biliary ductal dilation. Pancreas: Normal contours without ductal dilatation. No peripancreatic fluid collection. Spleen: No splenic laceration or hematoma. Adrenals/Urinary Tract: --Adrenal glands: No adrenal hemorrhage. --Right kidney/ureter: No hydronephrosis or perinephric hematoma. --Left kidney/ureter: No hydronephrosis or perinephric hematoma. --Urinary bladder: Unremarkable. Stomach/Bowel: --Stomach/Duodenum: No hiatal hernia or other gastric abnormality. Normal duodenal course and caliber. --Small bowel: There is a persistent high-grade small bowel obstruction with multiple small bowel loops measuring up to approximately 4.3 cm in diameter. Multiple air-fluid levels are noted. There appears to be a transition point in the right lower quadrant. There is no pneumatosis. No free air. --Colon: The colon is decompressed. There are postsurgical changes related to prior appendectomy and ileocecectomy. --Appendix: Surgically absent. Vascular/Lymphatic: Atherosclerotic calcification is present within the non-aneurysmal abdominal aorta, without hemodynamically significant stenosis. --No retroperitoneal lymphadenopathy. --No mesenteric lymphadenopathy. --No pelvic or inguinal lymphadenopathy. Reproductive: Unremarkable Other: There is a trace amount of free fluid in the patient's pelvis. The abdominal wall is normal. Musculoskeletal. No acute displaced fractures. IMPRESSION: 1. Persistent high-grade small bowel obstruction with transition point in the right lower quadrant. No  pneumatosis or free air. 2. Postsurgical changes as above.  No evidence for an abscess. Aortic Atherosclerosis (ICD10-I70.0). Electronically Signed   By: Katherine Mantlehristopher  Green M.D.   On: 02/12/2019 15:48   CT ABDOMEN PELVIS W CONTRAST  Result Date: 01/31/2019 CLINICAL DATA:  Abdominal distension status post ileocecal ectomy perforated appendicitis with abscess. EXAM: CT ABDOMEN AND PELVIS WITH CONTRAST TECHNIQUE: Multidetector CT imaging of the abdomen and pelvis was performed using the standard protocol following bolus administration of intravenous contrast. CONTRAST:  100mL OMNIPAQUE IOHEXOL 300 MG/ML  SOLN COMPARISON:  CT 01/23/2019  FINDINGS: Lower chest: Band atelectasis at the RIGHT lung base. Bilateral small effusions. Hepatobiliary: No focal hepatic lesion. Gallbladder normal. No biliary duct dilatation Pancreas: Pancreas is normal. No ductal dilatation. No pancreatic inflammation. Spleen: Normal spleen Adrenals/urinary tract: Adrenal glands and kidneys are normal. The ureters and bladder normal. Stomach/Bowel: The stomach is moderately distended with fluid. Duodenum appears normal. The proximal, mid and distal small bowel are fluid-filled and distended. For example bowel loop in the LEFT abdomen measuring 4.8 cm (image 53/2). Loop of bowel in the mid abdomen measuring 4.4 cm (image 56/2 The most distal small bowel in the RIGHT lower quadrant is decompressed. There is an anastomosis in the ascending colon. The small bowel leading up to this anastomosis (image 53/2) is decompressed. The ascending, transverse and descending colon are completely decompressed. No pneumatosis of the small bowel.  No portal venous gas. Vascular/Lymphatic: Abdominal aorta is normal caliber with atherosclerotic calcification. There is no retroperitoneal or periportal lymphadenopathy. No pelvic lymphadenopathy. Reproductive: Uterus and adnexa normal. Other: There is small volume free fluid the posterior cul-de-sac. This small amount  fluid does have a thin enhancing rim (image 74/2) suggesting peritonitis Musculoskeletal: No aggressive osseous lesion. IMPRESSION: 1. Markedly distended proximal small bowel and mid small bowel with transition to decompressed distal small bowel in the RIGHT lower quadrant and decompressed colon. Differential includes postsurgical ileus versus mechanical obstruction. 2. No portal venous gas or pneumatosis.  No evidence of perforation 3. Small volume of free fluid posterior cul-de-sac with enhancing peritoneal surface is concerning for peritonitis. 4. Moderate volume of fluid the stomach. Patient may benefit from NG tube decompression Electronically Signed   By: Genevive Bi M.D.   On: 01/31/2019 17:17   CT Abdomen Pelvis W Contrast  Result Date: 01/23/2019 CLINICAL DATA:  Abdominal pain with appendicitis suspected. EXAM: CT ABDOMEN AND PELVIS WITH CONTRAST TECHNIQUE: Multidetector CT imaging of the abdomen and pelvis was performed using the standard protocol following bolus administration of intravenous contrast. CONTRAST:  OMNIPAQUE IOHEXOL 300 MG/ML  SOLN COMPARISON:  None. FINDINGS: Lower chest: The lung bases are clear. The heart size is normal. Hepatobiliary: The liver is normal. Normal gallbladder.There is no biliary ductal dilation. Pancreas: Normal contours without ductal dilatation. No peripancreatic fluid collection. Spleen: No splenic laceration or hematoma. Adrenals/Urinary Tract: --Adrenal glands: No adrenal hemorrhage. --Right kidney/ureter: No hydronephrosis or perinephric hematoma. --Left kidney/ureter: No hydronephrosis or perinephric hematoma. --Urinary bladder: Unremarkable. Stomach/Bowel: --Stomach/Duodenum: No hiatal hernia or other gastric abnormality. Normal duodenal course and caliber. --Small bowel: No dilatation or inflammation. --Colon: No focal abnormality. --Appendix: There are significant periappendiceal inflammatory changes. The appendix is dilated measuring up to  approximately 2.3 cm. Multiple appendicoliths are noted within the distal appendix. There is some free fluid in the right lower quadrant without evidence for well-formed drainable collection. There is no free air. Vascular/Lymphatic: Atherosclerotic calcification is present within the non-aneurysmal abdominal aorta, without hemodynamically significant stenosis. --No retroperitoneal lymphadenopathy. --there are multiple enlarged lymph nodes in the right lower quadrant, presumably reactive in etiology. --No pelvic or inguinal lymphadenopathy. Reproductive: Unremarkable Other: There is a small amount of free fluid in the patient's pelvis, presumably reactive the abdominal wall is normal. Musculoskeletal. No acute displaced fractures. IMPRESSION: 1. Findings are consistent with acute uncomplicated appendicitis. 2. Multiple enlarged right lower quadrant lymph nodes, presumably reactive in etiology. 3. Atherosclerotic changes of the abdominal aorta, greater than expected for a patient of this age. Aortic Atherosclerosis (ICD10-I70.0). Electronically Signed   By: Beryle Quant.D.  On: 01/23/2019 19:24   DG Abd Portable 1V  Result Date: 01/28/2019 CLINICAL DATA:  Pt c/o abdomen pain and vomiting, Pt s/p OPEN ileocecectomy, Postop ileus EXAM: PORTABLE ABDOMEN - 1 VIEW COMPARISON:  CT abdomen pelvis 01/23/2019 FINDINGS: There are multiple dilated loops of small bowel measuring up to 4.8 cm in the central abdomen small amount of gas distally. No supine evidence for free air. Surgical staple line over the mid abdomen. No acute finding in the visualized skeleton. Lung bases are clear. IMPRESSION: Multiple dilated loops of small bowel in the central abdomen most likely represent ileus in the postoperative setting though small-bowel obstruction has a similar appearance. Electronically Signed   By: Emmaline Kluver M.D.   On: 01/28/2019 12:44   [place] EKG SITE RITE  Result Date: 02/01/2019 If Site Rite image not  attached, placement could not be confirmed due to current cardiac rhythm.    ED Course/Procedures   Clinical Course as of Feb 12 1923  Mon Feb 12, 2019  1626 Will Marlyne Beards [place] w Gen surgery to evaluate pt and admit. Requests NG tube.    [JR]    Clinical Course User Index [JR] Gunda Maqueda, [place] N, PA-C    Procedures  MDM   CT with high-grade obstruction with transition point in RLQ. NG tube placed which provided improvement in pt's symptoms. Gen surgery to admit for further management.      Atoya Andrew, [place] N, PA-C 02/12/19 1925    Margarita Grizzle, MD 02/13/19 [phone]

## 2019-02-12 NOTE — ED Triage Notes (Signed)
Pt is here due to continued severe pain after surgery on 12/9.  Pt had an acute appendicitis  With perforation and abcess s/p ileocecotomy.  Pt originally was doing ok after discharge but last Friday (12/25) pt began feeling nauseated and vomited once.  Pt called surgeons office and was told to call back on Monday but they could not see her until 1:30 today so she came to the ED as she was feeling increasing pain, abdominal bloating and increased vomiting.  Pt states that the symptoms have been increasing since Friday.  Pt describes a cycle of abdominal pain which increases and abdominal distention which increases and then nausea and vomiting after which she has some relief.  Pt vomit has been bile like.  Pt had her last BM this am (small one).  Pt states that she has been able to tolerate sips of water.

## 2019-02-12 NOTE — ED Notes (Signed)
Pt reports that Morphine has caused her bowels to stop working post op.  Requests different pain meds next.

## 2019-02-12 NOTE — ED Provider Notes (Signed)
[facility] EMERGENCY DEPARTMENT Provider Note   CSN: [id number] Arrival date & time: 02/12/19  1035     History Chief Complaint  Patient presents with  . Post-op Problem    Ruth Hubbard is a 38 y.o. female.  The history is provided by the patient and medical records. No language interpreter was used.       38 year old female presents ED for evaluation of potential postop complication.  Initially was seen in the ED on 01/23/2019 for abdominal pain.  She was found to have evidence of appendicitis with perforation and abscess formation.  She was admitted and had surgical intervention including iliocecectomy.  She subsequently developed developed postoperative ileus, subsequently discharged on 12/22.  Patient reports she was doing okay however for the past 3 days she developed nausea and vomiting as well as increased abdominal pain.  Vomiting is of bilious content.  Decreased bowel movement with very small amount of stool produced today.  She has difficulty passing flatus.  She endorsed nausea.  She does not complain of any fever chills no chest pain shortness of breath productive cough or dysuria.  No recent sick contact with anyone with COVID-19.  Past Medical History:  Diagnosis Date  . Acid reflux   . Bronchitis   . Goiter   . Murmur   . Sinus trouble     Patient Active Problem List   Diagnosis Date Noted  . Acute appendicitis with perforation and abscess s/p ileocectomy 01/24/2019 01/24/2019  . Palpitations 02/16/2018  . Cardiac murmur 02/16/2018    Past Surgical History:  Procedure Laterality Date  . LAPAROSCOPIC APPENDECTOMY N/A 01/24/2019   Procedure: OPEN ileocecectomy;  Surgeon: Jovita Kussmaul, MD;  Location: [facility];  Service: General;  Laterality: N/A;     OB History   No obstetric history on file.     Family History  Problem Relation Age of Onset  . Diabetes Mother   . Hypertension Mother   . Stroke Mother   . Hypertension Father      Social History   Tobacco Use  . Smoking status: Current Some Day Smoker  . Smokeless tobacco: Never Used  Substance Use Topics  . Alcohol use: Yes  . Drug use: Not on file    Home Medications Prior to Admission medications   Medication Sig Start Date End Date Taking? Authorizing Provider  acetaminophen (TYLENOL) 325 MG tablet Take 1-2 tablets (325-650 mg total) by mouth every 6 (six) hours as needed for mild pain, fever or headache. 02/06/19   Earnstine Regal, PA-C  ascorbic acid (VITAMIN C) 250 MG CHEW Chew 250 mg by mouth daily.    [provider]  Biotin w/ Vitamins C & E (HAIR SKIN & NAILS GUMMIES) 1250-7.5-7.5 MCG-MG-UNT CHEW Chew 1 tablet by mouth daily.    [provider]  Brimonidine Tartrate (LUMIFY) 0.025 % SOLN Place 1 drop into both eyes daily.    [provider]  Cyanocobalamin (VITAMIN B-12 ER PO) Take 1 tablet by mouth daily.    [provider]  VITAMIN E PO Take 1 capsule by mouth daily.    [provider]    Allergies    Patient has no known allergies.  Review of Systems   Review of Systems  All other systems reviewed and are negative.   Physical Exam Updated Vital Signs BP (!) 123/100 (BP Location: Right Arm) Comment: Pt emotional during vitals  Pulse 85   Temp 98.7 F (37.1 C) (Oral)  Resp 18   Wt 66.7 kg   SpO2 97%   BMI 25.23 kg/m   Physical Exam Vitals and nursing note reviewed.  Constitutional:      General: She is not in acute distress.    Appearance: She is well-developed.  HENT:     Head: Atraumatic.     Mouth/Throat:     Mouth: Mucous membranes are moist.  Eyes:     Conjunctiva/sclera: Conjunctivae normal.  Cardiovascular:     Rate and Rhythm: Normal rate and regular rhythm.     Pulses: Normal pulses.     Heart sounds: Normal heart sounds.  Pulmonary:     Breath sounds: Normal breath sounds.  Abdominal:     General: There is distension.     Comments: Abdomen is distended,  tympanic, with well-healing midline surgical scar without evidence of hernia.  Diminished bowel sounds on exam and diffusely tender.  Musculoskeletal:     Cervical back: Neck supple.  Skin:    Findings: No rash.  Neurological:     Mental Status: She is alert and oriented to person, place, and time.  Psychiatric:        Mood and Affect: Mood normal.     ED Results / Procedures / Treatments   Labs (all labs ordered are listed, but only abnormal results are displayed) Labs Reviewed  COMPREHENSIVE METABOLIC PANEL - Abnormal; Notable for the following components:      Result Value   Sodium 131 (*)    Chloride 84 (*)    BUN 27 (*)    Creatinine, Ser 1.34 (*)    Total Protein 8.3 (*)    GFR calc non Af Amer 50 (*)    GFR calc Af Amer 58 (*)    Anion gap 20 (*)    All other components within normal limits  CBC WITH DIFFERENTIAL/PLATELET - Abnormal; Notable for the following components:   WBC 15.7 (*)    RBC 5.18 (*)    Hemoglobin 15.2 (*)    Platelets 445 (*)    Neutro Abs 11.5 (*)    Monocytes Absolute 1.8 (*)    Abs Immature Granulocytes 0.16 (*)    All other components within normal limits  SARS CORONAVIRUS 2 (TAT 6-24 HRS)  LIPASE, BLOOD  URINALYSIS, ROUTINE W REFLEX MICROSCOPIC  I-STAT BETA HCG BLOOD, ED ([facility], [facility], [facility] ONLY)    EKG None  Radiology No results found.  Procedures Procedures (including critical care time)  Medications Ordered in ED Medications  morphine 4 MG/ML injection 4 mg (4 mg Intravenous Given 02/12/19 1502)  ondansetron (ZOFRAN) injection 4 mg (4 mg Intravenous Given 02/12/19 1502)  sodium chloride 0.9 % bolus 1,000 mL (1,000 mLs Intravenous New Bag/Given 02/12/19 1512)  iohexol (OMNIPAQUE) 300 MG/ML solution 80 mL (80 mLs Intravenous Contrast Given 02/12/19 1532)    ED Course  I have reviewed the triage vital signs and the nursing notes.  Pertinent labs & imaging results that were available during my care of the patient were reviewed by me  and considered in my medical decision making (see chart for details).    MDM Rules/Calculators/A&P                      BP 136/66   Pulse 72   Temp 99.2 F (37.3 C) (Oral)   Resp 13   Wt 66.7 kg   SpO2 94%   BMI 25.23 kg/m   Final Clinical Impression(s) / ED Diagnoses Final  diagnoses:  None    Rx / DC Orders ED Discharge Orders    None     2:39 PM Patient here with abdominal pain, nausea vomiting and a distended abdomen.  She had an appendicitis with perforation and abscess earlier this month requiring surgical repair.  I am concerned for suspect SBO from adhesion versus underlying infection from recently surgical managements of her perforated appendicitis.  Will obtain abdominal pelvic CT scan for further evaluation.  IV fluid given.  3:48 PM Pt sign out to oncoming provider who will f/u on CT result and will determine disposition.   Fayrene Helper, PA-C 02/12/19 1548    Margarita Grizzle, MD 02/22/19 1323

## 2019-02-12 NOTE — H&P (Signed)
[facility],  Ruth PalmaGina M Hubbard April 17, 1980  [id number].    Requesting MD: Fayrene HelperBowie Tran Chief Complaint: Abdominal pain nausea and vomiting. Reason for Consult: Small bowel obstruction   HPI:  Patient is a 38 year old female who was admitted on 01/23/2019 and was taken the operating room on 01/23/2019 by Dr. Royston SinnerPJ Toth III.  Patient underwent laparoscopic converted to open ileocecectomy for acute appendicitis with perforation.  Patient had a had intermittent pain on and off for about 10 days prior to coming to the ED.  She was taken the operating room, where they found the appendix and cecum were very densely stuck to the pelvic sidewall.  As this was peeled back they found a walled off abscess cavity.  The appendix appeared to be perforated where it joined the cecum and the entire area was woody and hard with chronic inflammation.  At this point the laparoscopic procedure was aborted and patient underwent open ileocecectomy.  She did well from the procedure, she was kept on IV antibiotics IV fluids and bowel rest.  She developed a significant ileus and was eventually placed on TNA.  Her bowel function eventually returned her diet was advanced, she is weaned off TNA.  By 02/06/2019 she was tolerating a soft diet and having bowel movements.  Her antibiotics were discontinued on 02/05/2019.  Patient reported to triage today that she originally did well from 12/22 through 02/09/19.  She began to feel nauseated at this point and vomited once.  She developed increasing abdominal pain, and abdominal distention she felt some relief after vomiting.  Because of persistent symptoms she presented to the ED this morning.  Work-up in the ED shows blood pressures 123/100.  She is afebrile.  Labs show sodium of 131, creatinine at 3.7, BUN of 27, creatinine of 1.34, anion gap is 20, LFTs are normal.  WBC 15.7, H/H 15.2/44.8, platelets 445,000.  hCG is negative Covid is negative, urinalysis  is unremarkable.  CT of the abdomen and pelvis with contrast: Hyperdense material in the gallbladder lumen which may represent sludge or stones no biliary ductal dilatation.  No hydronephrosis.  Small bowel shows there is a persistent high-grade small bowel obstruction with multiple loops measuring up to 4.3 cm.  There are multiple air-fluid levels noted. There appears to be a transition point in the right lower quadrant there is no pneumatosis and no free air.  The colon was decompressed and there are postsurgical clips related to the prior appendectomy/ileocecectomy.  We are asked to see.   ROS: Review of Systems  Constitutional: Negative.   HENT: Negative.   Eyes: Negative.   Respiratory: Negative.   Cardiovascular: Negative.   Gastrointestinal: Positive for abdominal pain, nausea and vomiting.       She had BM yesterday and today in ED  Genitourinary: Negative.   Musculoskeletal: Negative.   Skin: Negative.   Neurological: Negative.   Endo/Heme/Allergies: Negative.   Psychiatric/Behavioral: Negative.     Family History  Problem Relation Age of Onset  . Diabetes Mother   . Hypertension Mother   . Stroke Mother   . Hypertension Father     Past Medical History:  Diagnosis Date  . Acid reflux   . Bronchitis   . Goiter   . Murmur   . Sinus trouble     Past Surgical History:  Procedure Laterality Date  . LAPAROSCOPIC APPENDECTOMY N/A 01/24/2019   Procedure: OPEN ileocecectomy;  Surgeon: Griselda Mineroth, Paul III, MD;  Location: [facility];  Service:  General;  Laterality: N/A;    Social History:  reports that she has been smoking. She has never used smokeless tobacco. She reports current alcohol use. No history on file for drug.  Allergies: No Known Allergies  Prior to Admission medications   Medication Sig Start Date End Date Taking? Authorizing Provider  acetaminophen (TYLENOL) 325 MG tablet Take 1-2 tablets (325-650 mg total) by mouth every 6 (six) hours as needed for mild pain,  fever or headache. 02/06/19  Yes Sherrie George, PA-C  ascorbic acid (VITAMIN C) 250 MG CHEW Chew 250 mg by mouth daily.   Yes [provider]  Biotin w/ Vitamins C & E (HAIR SKIN & NAILS GUMMIES) 1250-7.5-7.5 MCG-MG-UNT CHEW Chew 1 tablet by mouth daily.   Yes [provider]  Brimonidine Tartrate (LUMIFY) 0.025 % SOLN Place 1 drop into both eyes daily.   Yes [provider]  Cyanocobalamin (VITAMIN B-12 ER PO) Take 1 tablet by mouth daily.   Yes [provider]  VITAMIN E PO Take 1 capsule by mouth daily.   Yes [provider]     Blood pressure (!) 99/49, pulse 74, temperature 99.2 F (37.3 C), temperature source Oral, resp. rate 13, weight 66.7 kg, SpO2 96 %. Physical Exam: Physical Exam Constitutional:      General: She is not in acute distress.    Appearance: Normal appearance. She is ill-appearing. She is not toxic-appearing.  HENT:     Head: Normocephalic and atraumatic.     Comments: NG in right nostril    Mouth/Throat:     Mouth: Mucous membranes are dry.     Pharynx: Oropharynx is clear.  Eyes:     General: No scleral icterus.    Comments: Pupils are equal.  Neck:     Vascular: No carotid bruit.  Cardiovascular:     Rate and Rhythm: Normal rate and regular rhythm.     Pulses: Normal pulses.     Heart sounds: Normal heart sounds.  Pulmonary:     Effort: Pulmonary effort is normal.     Breath sounds: Normal breath sounds.  Abdominal:     General: There is distension (mild distension).     Tenderness: There is abdominal tenderness.     Comments: BS decreased and high pitched  NG draining green bilious fluid  Musculoskeletal:     Cervical back: Normal range of motion and neck supple. No rigidity or tenderness.  Lymphadenopathy:     Cervical: No cervical adenopathy.  Skin:    General: Skin is warm.     Capillary Refill: Capillary refill takes less than 2 seconds.  Neurological:     General: No focal deficit present.      Mental Status: She is alert and oriented to person, place, and time.     Cranial Nerves: No cranial nerve deficit.  Psychiatric:        Mood and Affect: Mood normal.        Behavior: Behavior normal.        Thought Content: Thought content normal.        Judgment: Judgment normal.     Results for orders placed or performed during the hospital encounter of 02/12/19 (from the past 48 hour(s))  Comprehensive metabolic panel     Status: Abnormal   Collection Time: 02/12/19 11:45 AM  Result Value Ref Range   Sodium 131 (L) 135 - 145 mmol/L   Potassium 3.7 3.5 - 5.1 mmol/L   Chloride 84 (L) 98 -  111 mmol/L   CO2 27 22 - 32 mmol/L   Glucose, Bld 98 70 - 99 mg/dL   BUN 27 (H) 6 - 20 mg/dL   Creatinine, Ser 6.50 (H) 0.44 - 1.00 mg/dL   Calcium 35.4 8.9 - 65.6 mg/dL   Total Protein 8.3 (H) 6.5 - 8.1 g/dL   Albumin 4.5 3.5 - 5.0 g/dL   AST 22 15 - 41 U/L   ALT 18 0 - 44 U/L   Alkaline Phosphatase 52 38 - 126 U/L   Total Bilirubin 1.2 0.3 - 1.2 mg/dL   GFR calc non Af Amer 50 (L) >60 mL/min   GFR calc Af Amer 58 (L) >60 mL/min   Anion gap 20 (H) 5 - 15    Comment: Performed at [facility], [address], [place], [place] [address]  Lipase, blood     Status: None   Collection Time: 02/12/19 11:45 AM  Result Value Ref Range   Lipase 22 11 - 51 U/L    Comment: Performed at [facility], [address], [place], [place] [address]  CBC with Diff     Status: Abnormal   Collection Time: 02/12/19 11:45 AM  Result Value Ref Range   WBC 15.7 (H) 4.0 - 10.5 K/uL   RBC 5.18 (H) 3.87 - 5.11 MIL/uL   Hemoglobin 15.2 (H) 12.0 - 15.0 g/dL   HCT 74.9 44.9 - 67.5 %   MCV 86.5 80.0 - 100.0 fL   MCH 29.3 26.0 - 34.0 pg   MCHC 33.9 30.0 - 36.0 g/dL   RDW 91.6 38.4 - 66.5 %   Platelets 445 (H) 150 - 400 K/uL   nRBC 0.0 0.0 - 0.2 %   Neutrophils Relative % 74 %   Neutro Abs 11.5 (H) 1.7 - 7.7 K/uL   Lymphocytes Relative 13 %   Lymphs Abs 2.1 0.7 - 4.0 K/uL   Monocytes Relative  11 %   Monocytes Absolute 1.8 (H) 0.1 - 1.0 K/uL   Eosinophils Relative 1 %   Eosinophils Absolute 0.2 0.0 - 0.5 K/uL   Basophils Relative 0 %   Basophils Absolute 0.1 0.0 - 0.1 K/uL   Immature Granulocytes 1 %   Abs Immature Granulocytes 0.16 (H) 0.00 - 0.07 K/uL    Comment: Performed at [facility], [address] [place], [place] [address]  I-Stat beta hCG blood, ED     Status: None   Collection Time: 02/12/19 11:58 AM  Result Value Ref Range   I-stat hCG, quantitative <5.0 <5 mIU/mL   Comment 3            Comment:   GEST. AGE      CONC.  (mIU/mL)   <=1 WEEK        5 - 50     2 WEEKS       50 - 500     3 WEEKS       100 - 10,000     4 WEEKS     1,000 - 30,000        FEMALE AND NON-PREGNANT FEMALE:     LESS THAN 5 mIU/mL   Urinalysis, Routine w reflex microscopic     Status: Abnormal   Collection Time: 02/12/19  3:10 PM  Result Value Ref Range   Color, Urine AMBER (A) YELLOW    Comment: BIOCHEMICALS MAY BE AFFECTED BY COLOR   APPearance CLOUDY (A) CLEAR   Specific Gravity, Urine  1.034 (H) 1.005 - 1.030   pH 5.0 5.0 - 8.0   Glucose, UA NEGATIVE NEGATIVE mg/dL   Hgb urine dipstick NEGATIVE NEGATIVE   Bilirubin Urine NEGATIVE NEGATIVE   Ketones, ur 5 (A) NEGATIVE mg/dL   Protein, ur 30 (A) NEGATIVE mg/dL   Nitrite NEGATIVE NEGATIVE   Leukocytes,Ua NEGATIVE NEGATIVE   RBC / HPF 0-5 0 - 5 RBC/hpf   WBC, UA 0-5 0 - 5 WBC/hpf   Bacteria, UA NONE SEEN NONE SEEN   Squamous Epithelial / LPF 0-5 0 - 5   Hyaline Casts, UA PRESENT     Comment: Performed at [facility], [place] [address] [place], [place] [address]   CT ABDOMEN PELVIS W CONTRAST  Result Date: 02/12/2019 CLINICAL DATA:  Postop pain. Status post recent acute appendicitis. EXAM: CT ABDOMEN AND PELVIS WITH CONTRAST TECHNIQUE: Multidetector CT imaging of the abdomen and pelvis was performed using the standard protocol following bolus administration of intravenous contrast. CONTRAST:  100 cc of Omnipaque 350.  COMPARISON:  January 31, 2019 FINDINGS: Lower chest: The lung bases are clear. The heart size is normal. Hepatobiliary: The liver is normal. There is hyperdense material in the gallbladder lumen which may represent gallbladder sludge or stones.There is no biliary ductal dilation. Pancreas: Normal contours without ductal dilatation. No peripancreatic fluid collection. Spleen: No splenic laceration or hematoma. Adrenals/Urinary Tract: --Adrenal glands: No adrenal hemorrhage. --Right kidney/ureter: No hydronephrosis or perinephric hematoma. --Left kidney/ureter: No hydronephrosis or perinephric hematoma. --Urinary bladder: Unremarkable. Stomach/Bowel: --Stomach/Duodenum: No hiatal hernia or other gastric abnormality. Normal duodenal course and caliber. --Small bowel: There is a persistent high-grade small bowel obstruction with multiple small bowel loops measuring up to approximately 4.3 cm in diameter. Multiple air-fluid levels are noted. There appears to be a transition point in the right lower quadrant. There is no pneumatosis. No free air. --Colon: The colon is decompressed. There are postsurgical changes related to prior appendectomy and ileocecectomy. --Appendix: Surgically absent. Vascular/Lymphatic: Atherosclerotic calcification is present within the non-aneurysmal abdominal aorta, without hemodynamically significant stenosis. --No retroperitoneal lymphadenopathy. --No mesenteric lymphadenopathy. --No pelvic or inguinal lymphadenopathy. Reproductive: Unremarkable Other: There is a trace amount of free fluid in the patient's pelvis. The abdominal wall is normal. Musculoskeletal. No acute displaced fractures. IMPRESSION: 1. Persistent high-grade small bowel obstruction with transition point in the right lower quadrant. No pneumatosis or free air. 2. Postsurgical changes as above.  No evidence for an abscess. Aortic Atherosclerosis (ICD10-I70.0). Electronically Signed   By: Constance Holster M.D.   On:  02/12/2019 15:48      Assessment/Plan Mild postop malnutrition AKI Severe dehydration    Acute appendicitis with perforation Laparoscopic converted to open ileocecectomy for acute appendicitis with perforation 01/24/2019 Dr. Lorine Bears III Severe postop ileus.      Earnstine Regal [facility] 02/12/2019, 4:28 PM Please see Amion for pager number during day hours 7:00am-4:30pm

## 2019-02-13 ENCOUNTER — Inpatient Hospital Stay (HOSPITAL_COMMUNITY): Payer: BC Managed Care – PPO

## 2019-02-13 LAB — GLUCOSE, CAPILLARY
Glucose-Capillary: 135 mg/dL — ABNORMAL HIGH (ref 70–99)
Glucose-Capillary: 124 mg/dL — ABNORMAL HIGH (ref 70–99)
Glucose-Capillary: 130 mg/dL — ABNORMAL HIGH (ref 70–99)
Glucose-Capillary: 111 mg/dL — ABNORMAL HIGH (ref 70–99)

## 2019-02-13 LAB — BASIC METABOLIC PANEL
Anion gap: 11 (ref 5–15)
BUN: 20 mg/dL (ref 6–20)
CO2: 30 mmol/L (ref 22–32)
Calcium: 8.9 mg/dL (ref 8.9–10.3)
Chloride: 94 mmol/L — ABNORMAL LOW (ref 98–111)
Creatinine, Ser: 0.78 mg/dL (ref 0.44–1.00)
GFR calc Af Amer: >60 mL/min (ref >60)
GFR calc non Af Amer: >60 mL/min (ref >60)
Glucose, Bld: 133 mg/dL — ABNORMAL HIGH (ref 70–99)
Potassium: 3.6 mmol/L (ref 3.5–5.1)
Sodium: 135 mmol/L (ref 135–145)

## 2019-02-13 LAB — CBC
HCT: 34.4 % — ABNORMAL LOW (ref 36.0–46.0)
Hemoglobin: 12 g/dL (ref 12.0–15.0)
MCH: 29.7 pg (ref 26.0–34.0)
MCHC: 34.9 g/dL (ref 30.0–36.0)
MCV: 85.1 fL (ref 80.0–100.0)
Platelets: 341 10*3/uL (ref 150–400)
RBC: 4.04 MIL/uL (ref 3.87–5.11)
RDW: 14.6 % (ref 11.5–15.5)
WBC: 10.9 10*3/uL — ABNORMAL HIGH (ref 4.0–10.5)
nRBC: 0 % (ref 0.0–0.2)

## 2019-02-13 LAB — PREALBUMIN: Prealbumin: 27.2 mg/dL (ref 18–38)

## 2019-02-13 MED ORDER — POTASSIUM CHLORIDE 10 MEQ/100ML IV SOLN
10.0000 meq | INTRAVENOUS | Status: AC
  Administered 2019-02-13 (×4): 10 meq via INTRAVENOUS
  Filled 2019-02-13 (×4): qty 100

## 2019-02-13 MED ORDER — OXYCODONE HCL 5 MG PO TABS
5.0000 mg | ORAL_TABLET | ORAL | Status: DC | PRN
  Administered 2019-02-13: 5 mg via ORAL
  Filled 2019-02-13: qty 1

## 2019-02-13 MED ORDER — SIMETHICONE 80 MG PO CHEW
160.0000 mg | CHEWABLE_TABLET | Freq: Four times a day (QID) | ORAL | Status: DC | PRN
  Administered 2019-02-14 – 2019-02-15 (×2): 160 mg via ORAL
  Filled 2019-02-13 (×2): qty 2

## 2019-02-13 NOTE — Progress Notes (Signed)
Pt tolerated well clear liquids from the floor, Pt denied nausea, vomiting and abdominal pain, Pt had been passing gas and had 1 BM. NGT removed per order.

## 2019-02-13 NOTE — Progress Notes (Signed)
NGT clamped per order. 

## 2019-02-13 NOTE — Plan of Care (Signed)

## 2019-02-13 NOTE — Progress Notes (Signed)
Pt complained of abdominal pain, PRN med given, denies nausea and vomiting at this time.

## 2019-02-13 NOTE — Plan of Care (Signed)
  Problem: Education: Goal: Knowledge of General Education information will improve Description: Including pain rating scale, medication(s)/side effects and non-pharmacologic comfort measures Outcome: Progressing   Problem: Safety: Goal: Ability to remain free from injury will improve Outcome: Progressing   

## 2019-02-13 NOTE — Progress Notes (Signed)
Subjective: CC: Patient reports since NGT placement her symptoms have completely resolved.  She denies any abdominal pain, bloating, nausea.  She has begun to pass more flatus.  She reports she has had 3 BMs since admission.  To moderate, loose/soft stools as well as 1 small liquidy stool.  No bloody BMs.   Objective: Vital signs in last 24 hours: Temp:  [97.5 F (36.4 C)-99.5 F (37.5 C)] 98.8 F (37.1 C) (12/29 0815) Pulse Rate:  [61-95] 61 (12/29 1017) Resp:  [13-18] 18 (12/29 0815) BP: (96-146)/(49-82) 100/58 (12/29 1017) SpO2:  [90 %-99 %] 98 % (12/29 0815) Weight:  [63.7 kg] 63.7 kg (12/29 0036) Last BM Date: 02/13/19  Intake/Output from previous day: 12/28 0701 - 12/29 0700 In: 1000 [IV Piggyback:1000] Out: 950 [Emesis/NG output:950] Intake/Output this shift: Total I/O In: 1832.9 [I.V.:1832.9] Out: -   PE: Gen:  Alert, NAD, pleasant HEENT: NGT in place. 950 out since placement. Bilious Pulm: Normal rate and effort  Abd: Soft, mild distension, NT, +BS, incisions well healed.  Ext:  No LE edema Psych: A&Ox3  Skin: no rashes noted, warm and dry  Lab Results:  Recent Labs    02/12/19 1145 02/13/19 0305  WBC 15.7* 10.9*  HGB 15.2* 12.0  HCT 44.8 34.4*  PLT 445* 341   BMET Recent Labs    02/12/19 1145 02/13/19 0305  NA 131* 135  K 3.7 3.6  CL 84* 94*  CO2 27 30  GLUCOSE 98 133*  BUN 27* 20  CREATININE 1.34* 0.78  CALCIUM 10.0 8.9   PT/INR No results for input(s): LABPROT, INR in the last 72 hours. CMP     Component Value Date/Time   NA 135 02/13/2019 0305   K 3.6 02/13/2019 0305   CL 94 (L) 02/13/2019 0305   CO2 30 02/13/2019 0305   GLUCOSE 133 (H) 02/13/2019 0305   BUN 20 02/13/2019 0305   CREATININE 0.78 02/13/2019 0305   CALCIUM 8.9 02/13/2019 0305   PROT 8.3 (H) 02/12/2019 1145   ALBUMIN 4.5 02/12/2019 1145   AST 22 02/12/2019 1145   ALT 18 02/12/2019 1145   ALKPHOS 52 02/12/2019 1145   BILITOT 1.2 02/12/2019 1145   GFRNONAA  >60 02/13/2019 0305   GFRAA >60 02/13/2019 0305   Lipase     Component Value Date/Time   LIPASE 22 02/12/2019 1145       Studies/Results: CT ABDOMEN PELVIS W CONTRAST  Result Date: 02/12/2019 CLINICAL DATA:  Postop pain. Status post recent acute appendicitis. EXAM: CT ABDOMEN AND PELVIS WITH CONTRAST TECHNIQUE: Multidetector CT imaging of the abdomen and pelvis was performed using the standard protocol following bolus administration of intravenous contrast. CONTRAST:  100 cc of Omnipaque 350. COMPARISON:  January 31, 2019 FINDINGS: Lower chest: The lung bases are clear. The heart size is normal. Hepatobiliary: The liver is normal. There is hyperdense material in the gallbladder lumen which may represent gallbladder sludge or stones.There is no biliary ductal dilation. Pancreas: Normal contours without ductal dilatation. No peripancreatic fluid collection. Spleen: No splenic laceration or hematoma. Adrenals/Urinary Tract: --Adrenal glands: No adrenal hemorrhage. --Right kidney/ureter: No hydronephrosis or perinephric hematoma. --Left kidney/ureter: No hydronephrosis or perinephric hematoma. --Urinary bladder: Unremarkable. Stomach/Bowel: --Stomach/Duodenum: No hiatal hernia or other gastric abnormality. Normal duodenal course and caliber. --Small bowel: There is a persistent high-grade small bowel obstruction with multiple small bowel loops measuring up to approximately 4.3 cm in diameter. Multiple air-fluid levels are noted. There appears to be a  transition point in the right lower quadrant. There is no pneumatosis. No free air. --Colon: The colon is decompressed. There are postsurgical changes related to prior appendectomy and ileocecectomy. --Appendix: Surgically absent. Vascular/Lymphatic: Atherosclerotic calcification is present within the non-aneurysmal abdominal aorta, without hemodynamically significant stenosis. --No retroperitoneal lymphadenopathy. --No mesenteric lymphadenopathy. --No  pelvic or inguinal lymphadenopathy. Reproductive: Unremarkable Other: There is a trace amount of free fluid in the patient's pelvis. The abdominal wall is normal. Musculoskeletal. No acute displaced fractures. IMPRESSION: 1. Persistent high-grade small bowel obstruction with transition point in the right lower quadrant. No pneumatosis or free air. 2. Postsurgical changes as above.  No evidence for an abscess. Aortic Atherosclerosis (ICD10-I70.0). Electronically Signed   By: Katherine Mantle M.D.   On: 02/12/2019 15:48   DG Abd Portable 1V  Result Date: 02/13/2019 CLINICAL DATA:  Small-bowel obstruction. EXAM: PORTABLE ABDOMEN - 1 VIEW COMPARISON:  CT abdomen/pelvis 02/12/2019 FINDINGS: Enteric tube with tip likely post pyloric in position. Again demonstrated are dilated small bowel loops throughout much of the abdomen, measuring up to 5 cm in diameter. Evaluation for free air is limited on supine radiograph. No acute bony abnormality. IMPRESSION: Enteric tube with tip likely post pyloric in position. Dilated small bowel loops throughout much of the abdomen, consistent with persistent high-grade small-bowel obstruction. Electronically Signed   By: Jackey Loge DO   On: 02/13/2019 07:28    Anti-infectives: Anti-infectives (From admission, onward)   None       Assessment/Plan Mild postop malnutrition - check pre-alb AKI - resolved, Cr 0.78 Anemia -Hgb 12.0. Likely dilutional. AM CBC.   Acute appendicitis with perforation Laparoscopic converted to open ileocecectomy for acute appendicitis with perforation 01/24/2019 Dr. Royston Sinner III Severe postop ileus - X-ray this a.m. with continued dilated small bowel loops throughout abdomen, however patient has had return of bowel function and resolution of abdominal distention, nausea and bloating.  Will trial NG tube clamping. - Keep Mg >2 and K >4 - Mobilize for bowel function  FEN - Clamp NGT, sips of clears VTE - SCDs, heparin ID - None   LOS: 1  day    Jacinto Halim , [facility] 02/13/2019, 10:52 AM Please see Amion for pager number during day hours 7:00am-4:30pm

## 2019-02-14 ENCOUNTER — Inpatient Hospital Stay (HOSPITAL_COMMUNITY): Payer: BC Managed Care – PPO

## 2019-02-14 LAB — CBC
HCT: 33 % — ABNORMAL LOW (ref 36.0–46.0)
Hemoglobin: 11.1 g/dL — ABNORMAL LOW (ref 12.0–15.0)
MCH: 29.8 pg (ref 26.0–34.0)
MCHC: 33.6 g/dL (ref 30.0–36.0)
MCV: 88.5 fL (ref 80.0–100.0)
Platelets: 314 10*3/uL (ref 150–400)
RBC: 3.73 MIL/uL — ABNORMAL LOW (ref 3.87–5.11)
RDW: 15.4 % (ref 11.5–15.5)
WBC: 7.6 10*3/uL (ref 4.0–10.5)
nRBC: 0 % (ref 0.0–0.2)

## 2019-02-14 LAB — BASIC METABOLIC PANEL
Anion gap: 6 (ref 5–15)
BUN: <5 mg/dL — ABNORMAL LOW (ref 6–20)
CO2: 26 mmol/L (ref 22–32)
Calcium: 8.6 mg/dL — ABNORMAL LOW (ref 8.9–10.3)
Chloride: 107 mmol/L (ref 98–111)
Creatinine, Ser: 0.54 mg/dL (ref 0.44–1.00)
GFR calc Af Amer: >60 mL/min (ref >60)
GFR calc non Af Amer: >60 mL/min (ref >60)
Glucose, Bld: 114 mg/dL — ABNORMAL HIGH (ref 70–99)
Potassium: 4.5 mmol/L (ref 3.5–5.1)
Sodium: 139 mmol/L (ref 135–145)

## 2019-02-14 LAB — MAGNESIUM: Magnesium: 1.9 mg/dL (ref 1.7–2.4)

## 2019-02-14 MED ORDER — SODIUM CHLORIDE 0.9 % IV BOLUS
500.0000 mL | Freq: Once | INTRAVENOUS | Status: AC
  Administered 2019-02-14: 500 mL via INTRAVENOUS

## 2019-02-14 NOTE — Progress Notes (Signed)
Patient ID: Ruth PalmaGina M Lynn, female   DOB: 1980-12-22, 38 y.o.   MRN: [id number] [facility] Progress Note:   * No surgery found *  Subjective: Mental status is clear;  No pain complaints Objective: Vital signs in last 24 hours: Temp:  [97.9 F (36.6 C)-98.7 F (37.1 C)] 98.7 F (37.1 C) (12/30 0906) Pulse Rate:  [60-72] 72 (12/30 0906) Resp:  [14-18] 18 (12/30 0906) BP: (91-110)/(45-61) 96/59 (12/30 0906) SpO2:  [97 %-99 %] 99 % (12/30 0906)  Intake/Output from previous day: 12/29 0701 - 12/30 0700 In: 3760.7 [P.O.:780; I.V.:2541.3; IV Piggyback:439.4] Out: 200 [Emesis/NG output:200] Intake/Output this shift: No intake/output data recorded.  Physical Exam: Work of breathing is normal.  Abdomen is soft and nontender-passing gas  Lab Results:  Results for orders placed or performed during the hospital encounter of 02/12/19 (from the past 48 hour(s))  Comprehensive metabolic panel     Status: Abnormal   Collection Time: 02/12/19 11:45 AM  Result Value Ref Range   Sodium 131 (L) 135 - 145 mmol/L   Potassium 3.7 3.5 - 5.1 mmol/L   Chloride 84 (L) 98 - 111 mmol/L   CO2 27 22 - 32 mmol/L   Glucose, Bld 98 70 - 99 mg/dL   BUN 27 (H) 6 - 20 mg/dL   Creatinine, Ser 7.821.34 (H) 0.44 - 1.00 mg/dL   Calcium 95.610.0 8.9 - 21.310.3 mg/dL   Total Protein 8.3 (H) 6.5 - 8.1 g/dL   Albumin 4.5 3.5 - 5.0 g/dL   AST 22 15 - 41 U/L   ALT 18 0 - 44 U/L   Alkaline Phosphatase 52 38 - 126 U/L   Total Bilirubin 1.2 0.3 - 1.2 mg/dL   GFR calc non Af Amer 50 (L) >60 mL/min   GFR calc Af Amer 58 (L) >60 mL/min   Anion gap 20 (H) 5 - 15    Comment: Performed at [facility], [address] [place], [place] [address]  Lipase, blood     Status: None   Collection Time: 02/12/19 11:45 AM  Result Value Ref Range   Lipase 22 11 - 51 U/L    Comment: Performed at [facility], [address] [place], [place] [address]  CBC with Diff     Status: Abnormal   Collection Time: 02/12/19 11:45  AM  Result Value Ref Range   WBC 15.7 (H) 4.0 - 10.5 K/uL   RBC 5.18 (H) 3.87 - 5.11 MIL/uL   Hemoglobin 15.2 (H) 12.0 - 15.0 g/dL   HCT 29.544.8 28.436.0 - 13.246.0 %   MCV 86.5 80.0 - 100.0 fL   MCH 29.3 26.0 - 34.0 pg   MCHC 33.9 30.0 - 36.0 g/dL   RDW 44.014.6 10.211.5 - 72.515.5 %   Platelets 445 (H) 150 - 400 K/uL   nRBC 0.0 0.0 - 0.2 %   Neutrophils Relative % 74 %   Neutro Abs 11.5 (H) 1.7 - 7.7 K/uL   Lymphocytes Relative 13 %   Lymphs Abs 2.1 0.7 - 4.0 K/uL   Monocytes Relative 11 %   Monocytes Absolute 1.8 (H) 0.1 - 1.0 K/uL   Eosinophils Relative 1 %   Eosinophils Absolute 0.2 0.0 - 0.5 K/uL   Basophils Relative 0 %   Basophils Absolute 0.1 0.0 - 0.1 K/uL   Immature Granulocytes 1 %   Abs Immature Granulocytes 0.16 (H) 0.00 - 0.07 K/uL    Comment: Performed at [facility], [address] [place], [place] [address]  I-Stat beta hCG blood, ED     Status: None   Collection Time: 02/12/19 11:58 AM  Result Value Ref Range   I-stat hCG, quantitative <5.0 <5 mIU/mL   Comment 3            Comment:   GEST. AGE      CONC.  (mIU/mL)   <=1 WEEK        5 - 50     2 WEEKS       50 - 500     3 WEEKS       100 - 10,000     4 WEEKS     1,000 - 30,000        FEMALE AND NON-PREGNANT FEMALE:     LESS THAN 5 mIU/mL   SARS CORONAVIRUS 2 (TAT 6-24 HRS) Nasopharyngeal Nasopharyngeal Swab     Status: None   Collection Time: 02/12/19  2:36 PM   Specimen: Nasopharyngeal Swab  Result Value Ref Range   SARS Coronavirus 2 NEGATIVE NEGATIVE    Comment: (NOTE) SARS-CoV-2 target nucleic acids are NOT DETECTED. The SARS-CoV-2 RNA is generally detectable in upper and lower respiratory specimens during the acute phase of infection. Negative results do not preclude SARS-CoV-2 infection, do not rule out co-infections with other pathogens, and should not be used as the sole basis for treatment or other patient management decisions. Negative results must be combined with clinical observations, patient history,  and epidemiological information. The expected result is Negative. Fact Sheet for Patients: HairSlick.no Fact Sheet for Healthcare Providers: quierodirigir.com This test is not yet approved or cleared by the [place] FDA and  has been authorized for detection and/or diagnosis of SARS-CoV-2 by FDA under an Emergency Use Authorization (EUA). This EUA will remain  in effect (meaning this test can be used) for the duration of the COVID-19 declaration under Section 56 4(b)(1) of the Act, 21 U.S.C. section 360bbb-3(b)(1), unless the authorization is terminated or revoked sooner. Performed at [facility], [address], [place], [place] [address]   Urinalysis, Routine w reflex microscopic     Status: Abnormal   Collection Time: 02/12/19  3:10 PM  Result Value Ref Range   Color, Urine AMBER (A) YELLOW    Comment: BIOCHEMICALS MAY BE AFFECTED BY COLOR   APPearance CLOUDY (A) CLEAR   Specific Gravity, Urine 1.034 (H) 1.005 - 1.030   pH 5.0 5.0 - 8.0   Glucose, UA NEGATIVE NEGATIVE mg/dL   Hgb urine dipstick NEGATIVE NEGATIVE   Bilirubin Urine NEGATIVE NEGATIVE   Ketones, ur 5 (A) NEGATIVE mg/dL   Protein, ur 30 (A) NEGATIVE mg/dL   Nitrite NEGATIVE NEGATIVE   Leukocytes,Ua NEGATIVE NEGATIVE   RBC / HPF 0-5 0 - 5 RBC/hpf   WBC, UA 0-5 0 - 5 WBC/hpf   Bacteria, UA NONE SEEN NONE SEEN   Squamous Epithelial / LPF 0-5 0 - 5   Hyaline Casts, UA PRESENT     Comment: Performed at [facility], [address], [place], [place] [address]  Basic metabolic panel     Status: Abnormal   Collection Time: 02/13/19  3:05 AM  Result Value Ref Range   Sodium 135 135 - 145 mmol/L   Potassium 3.6 3.5 - 5.1 mmol/L   Chloride 94 (L) 98 - 111 mmol/L   CO2 30 22 - 32 mmol/L   Glucose, Bld 133 (H) 70 - 99 mg/dL  BUN 20 6 - 20 mg/dL   Creatinine, Ser 7.84 0.44 - 1.00 mg/dL   Calcium 8.9 8.9 - 69.6 mg/dL   GFR calc non Af Amer >60 >60  mL/min   GFR calc Af Amer >60 >60 mL/min   Anion gap 11 5 - 15    Comment: Performed at [facility], [address] [place], [place] [address]  CBC     Status: Abnormal   Collection Time: 02/13/19  3:05 AM  Result Value Ref Range   WBC 10.9 (H) 4.0 - 10.5 K/uL   RBC 4.04 3.87 - 5.11 MIL/uL   Hemoglobin 12.0 12.0 - 15.0 g/dL    Comment: REPEATED TO VERIFY   HCT 34.4 (L) 36.0 - 46.0 %   MCV 85.1 80.0 - 100.0 fL   MCH 29.7 26.0 - 34.0 pg   MCHC 34.9 30.0 - 36.0 g/dL   RDW 41.3 24.4 - 01.0 %   Platelets 341 150 - 400 K/uL   nRBC 0.0 0.0 - 0.2 %    Comment: Performed at [facility], [address] [place], [place] [address]  Glucose, capillary     Status: Abnormal   Collection Time: 02/13/19  6:44 AM  Result Value Ref Range   Glucose-Capillary 135 (H) 70 - 99 mg/dL  Glucose, capillary     Status: Abnormal   Collection Time: 02/13/19 11:17 AM  Result Value Ref Range   Glucose-Capillary 124 (H) 70 - 99 mg/dL  Prealbumin     Status: None   Collection Time: 02/13/19 11:24 AM  Result Value Ref Range   Prealbumin 27.2 18 - 38 mg/dL    Comment: Performed at [facility], [address] [place], [place] [address]  Glucose, capillary     Status: Abnormal   Collection Time: 02/13/19  4:16 PM  Result Value Ref Range   Glucose-Capillary 130 (H) 70 - 99 mg/dL  Glucose, capillary     Status: Abnormal   Collection Time: 02/13/19  9:18 PM  Result Value Ref Range   Glucose-Capillary 111 (H) 70 - 99 mg/dL  Basic metabolic panel     Status: Abnormal   Collection Time: 02/14/19  5:13 AM  Result Value Ref Range   Sodium 139 135 - 145 mmol/L   Potassium 4.5 3.5 - 5.1 mmol/L   Chloride 107 98 - 111 mmol/L   CO2 26 22 - 32 mmol/L   Glucose, Bld 114 (H) 70 - 99 mg/dL   BUN <5 (L) 6 - 20 mg/dL   Creatinine, Ser 3.47 0.44 - 1.00 mg/dL   Calcium 8.6 (L) 8.9 - 10.3 mg/dL   GFR calc non Af Amer >60 >60 mL/min   GFR calc Af Amer >60 >60 mL/min   Anion gap 6 5 - 15    Comment:  Performed at [facility], [address], [place], [place] [address]  CBC     Status: Abnormal   Collection Time: 02/14/19  5:13 AM  Result Value Ref Range   WBC 7.6 4.0 - 10.5 K/uL   RBC 3.73 (L) 3.87 - 5.11 MIL/uL   Hemoglobin 11.1 (L) 12.0 - 15.0 g/dL   HCT 63.8 (L) 75.6 - 43.3 %   MCV 88.5 80.0 - 100.0 fL   MCH 29.8 26.0 - 34.0 pg   MCHC 33.6 30.0 - 36.0 g/dL   RDW 29.5 18.8 - 41.6 %   Platelets 314 150 - 400 K/uL   nRBC 0.0  0.0 - 0.2 %    Comment: Performed at [facility], [address] [place], [place] [address]  Magnesium     Status: None   Collection Time: 02/14/19  5:13 AM  Result Value Ref Range   Magnesium 1.9 1.7 - 2.4 mg/dL    Comment: Performed at [facility] [address] [place], [place] [address]    Radiology/Results: CT ABDOMEN PELVIS W CONTRAST  Result Date: 02/12/2019 CLINICAL DATA:  Postop pain. Status post recent acute appendicitis. EXAM: CT ABDOMEN AND PELVIS WITH CONTRAST TECHNIQUE: Multidetector CT imaging of the abdomen and pelvis was performed using the standard protocol following bolus administration of intravenous contrast. CONTRAST:  100 cc of Omnipaque 350. COMPARISON:  January 31, 2019 FINDINGS: Lower chest: The lung bases are clear. The heart size is normal. Hepatobiliary: The liver is normal. There is hyperdense material in the gallbladder lumen which may represent gallbladder sludge or stones.There is no biliary ductal dilation. Pancreas: Normal contours without ductal dilatation. No peripancreatic fluid collection. Spleen: No splenic laceration or hematoma. Adrenals/Urinary Tract: --Adrenal glands: No adrenal hemorrhage. --Right kidney/ureter: No hydronephrosis or perinephric hematoma. --Left kidney/ureter: No hydronephrosis or perinephric hematoma. --Urinary bladder: Unremarkable. Stomach/Bowel: --Stomach/Duodenum: No hiatal hernia or other gastric abnormality. Normal duodenal course and caliber. --Small bowel: There is a  persistent high-grade small bowel obstruction with multiple small bowel loops measuring up to approximately 4.3 cm in diameter. Multiple air-fluid levels are noted. There appears to be a transition point in the right lower quadrant. There is no pneumatosis. No free air. --Colon: The colon is decompressed. There are postsurgical changes related to prior appendectomy and ileocecectomy. --Appendix: Surgically absent. Vascular/Lymphatic: Atherosclerotic calcification is present within the non-aneurysmal abdominal aorta, without hemodynamically significant stenosis. --No retroperitoneal lymphadenopathy. --No mesenteric lymphadenopathy. --No pelvic or inguinal lymphadenopathy. Reproductive: Unremarkable Other: There is a trace amount of free fluid in the patient's pelvis. The abdominal wall is normal. Musculoskeletal. No acute displaced fractures. IMPRESSION: 1. Persistent high-grade small bowel obstruction with transition point in the right lower quadrant. No pneumatosis or free air. 2. Postsurgical changes as above.  No evidence for an abscess. Aortic Atherosclerosis (ICD10-I70.0). Electronically Signed   By: Constance Holster M.D.   On: 02/12/2019 15:48   DG Abd 2 Views  Result Date: 02/14/2019 CLINICAL DATA:  Abdominal pain. Appendectomy 01/24/2019. EXAM: ABDOMEN - 2 VIEW COMPARISON:  Abdominal radiographs earlier this day. CT 02/12/2019 FINDINGS: Enteric tube is no longer seen. Persistent but decreased gaseous small bowel distension from exam earlier this day, few air-fluid levels persist. There is air in the colon. No evidence of free air. Enteric sutures noted in the right lower quadrant. Lung bases are clear. IMPRESSION: Persistent but decreased gaseous small bowel distension from exam earlier this day, consistent with improving small bowel obstruction. Electronically Signed   By: Keith Rake M.D.   On: 02/14/2019 02:09   DG Abd Portable 1V  Result Date: 02/13/2019 CLINICAL DATA:  Small-bowel  obstruction. EXAM: PORTABLE ABDOMEN - 1 VIEW COMPARISON:  CT abdomen/pelvis 02/12/2019 FINDINGS: Enteric tube with tip likely post pyloric in position. Again demonstrated are dilated small bowel loops throughout much of the abdomen, measuring up to 5 cm in diameter. Evaluation for free air is limited on supine radiograph. No acute bony abnormality. IMPRESSION: Enteric tube with tip likely post pyloric in position. Dilated small bowel loops throughout much of the abdomen, consistent with persistent high-grade small-bowel obstruction. Electronically Signed   By: Kellie Simmering DO  On: 02/13/2019 07:28    Anti-infectives: Anti-infectives (From admission, onward)   None      Assessment/Plan: Problem List: Patient Active Problem List   Diagnosis Date Noted  . SBO (small bowel obstruction) (HCC) 02/12/2019  . Acute appendicitis with perforation and abscess s/p ileocectomy 01/24/2019 01/24/2019  . Palpitations 02/16/2018  . Cardiac murmur 02/16/2018    Passing gas and flatus-will advance to full liquid diet * No surgery found *    LOS: 2 days   Matt B. Daphine Deutscher, MD, [facility] [phone] beeper [phone]  02/14/2019 9:11 AM

## 2019-02-14 NOTE — Plan of Care (Addendum)
Pt complaining of sharp sudden pains that pass once she passes gas. Reports 5 BMs on 12/29.  Pt requested another CT scan as she thinks the NG tube was removed too early. X-ray was ordered. Tolerating clears well.   Problem: Education: Goal: Knowledge of General Education information will improve Description: Including pain rating scale, medication(s)/side effects and non-pharmacologic comfort measures Outcome: Progressing   Problem: Safety: Goal: Ability to remain free from injury will improve Outcome: Progressing

## 2019-02-15 ENCOUNTER — Other Ambulatory Visit: Payer: Self-pay

## 2019-02-15 LAB — BASIC METABOLIC PANEL
Anion gap: 5 (ref 5–15)
BUN: <5 mg/dL — ABNORMAL LOW (ref 6–20)
CO2: 22 mmol/L (ref 22–32)
Calcium: 8.4 mg/dL — ABNORMAL LOW (ref 8.9–10.3)
Chloride: 112 mmol/L — ABNORMAL HIGH (ref 98–111)
Creatinine, Ser: 0.49 mg/dL (ref 0.44–1.00)
GFR calc Af Amer: >60 mL/min (ref >60)
GFR calc non Af Amer: >60 mL/min (ref >60)
Glucose, Bld: 105 mg/dL — ABNORMAL HIGH (ref 70–99)
Potassium: 4.5 mmol/L (ref 3.5–5.1)
Sodium: 139 mmol/L (ref 135–145)

## 2019-02-15 LAB — CBC
HCT: 33.1 % — ABNORMAL LOW (ref 36.0–46.0)
Hemoglobin: 10.8 g/dL — ABNORMAL LOW (ref 12.0–15.0)
MCH: 29.3 pg (ref 26.0–34.0)
MCHC: 32.6 g/dL (ref 30.0–36.0)
MCV: 89.7 fL (ref 80.0–100.0)
Platelets: 313 10*3/uL (ref 150–400)
RBC: 3.69 MIL/uL — ABNORMAL LOW (ref 3.87–5.11)
RDW: 15.7 % — ABNORMAL HIGH (ref 11.5–15.5)
WBC: 9.4 10*3/uL (ref 4.0–10.5)
nRBC: 0 % (ref 0.0–0.2)

## 2019-02-15 MED ORDER — MORPHINE SULFATE (PF) 2 MG/ML IV SOLN: 1.0000 mg | INTRAVENOUS | Status: DC | PRN

## 2019-02-15 NOTE — Progress Notes (Signed)
Pt is tolerating soft diet, denies abd pain. Encouraged pt to walk in the hall today.

## 2019-02-15 NOTE — Plan of Care (Signed)
  Problem: Education: Goal: Knowledge of General Education information will improve Description: Including pain rating scale, medication(s)/side effects and non-pharmacologic comfort measures Outcome: Progressing   Problem: Pain Managment: Goal: General experience of comfort will improve Outcome: Progressing   Problem: Safety: Goal: Ability to remain free from injury will improve Outcome: Progressing   

## 2019-02-15 NOTE — Progress Notes (Addendum)
Pt SBP running in the 90s. Notified Kinsinger. Pt is a little concerned, but with dayshift hand-off I was told the doctors were aware of it and it was okay.   Kinsinger returned page, states it is okay. Will continue to monitor.

## 2019-02-15 NOTE — Progress Notes (Signed)
Patient ID: Ruth Hubbard, female   DOB: 1981/01/25, 38 y.o.   MRN: [id number] [facility] Surgery Progress Note:   Subjective:   No pain complaints, tolerating liquids and having bowel function Objective: Vital signs in last 24 hours: Temp:  [98.4 F (36.9 C)-99.5 F (37.5 C)] 98.9 F (37.2 C) (12/31 0809) Pulse Rate:  [58-79] 66 (12/31 0809) Resp:  [14-18] 17 (12/31 0809) BP: (92-110)/(40-71) 110/71 (12/31 0809) SpO2:  [98 %-100 %] 98 % (12/31 0809)  Intake/Output from previous day: 12/30 0701 - 12/31 0700 In: 480 [P.O.:480] Out: -  Intake/Output this shift: No intake/output data recorded.  Physical Exam: NAD   Abdomen is soft   Lab Results:  Results for orders placed or performed during the hospital encounter of 02/12/19 (from the past 48 hour(s))  Glucose, capillary     Status: Abnormal   Collection Time: 02/13/19 11:17 AM  Result Value Ref Range   Glucose-Capillary 124 (H) 70 - 99 mg/dL  Prealbumin     Status: None   Collection Time: 02/13/19 11:24 AM  Result Value Ref Range   Prealbumin 27.2 18 - 38 mg/dL    Comment: Performed at [facility], [address] [place], [place] [address]  Glucose, capillary     Status: Abnormal   Collection Time: 02/13/19  4:16 PM  Result Value Ref Range   Glucose-Capillary 130 (H) 70 - 99 mg/dL  Glucose, capillary     Status: Abnormal   Collection Time: 02/13/19  9:18 PM  Result Value Ref Range   Glucose-Capillary 111 (H) 70 - 99 mg/dL  Basic metabolic panel     Status: Abnormal   Collection Time: 02/14/19  5:13 AM  Result Value Ref Range   Sodium 139 135 - 145 mmol/L   Potassium 4.5 3.5 - 5.1 mmol/L   Chloride 107 98 - 111 mmol/L   CO2 26 22 - 32 mmol/L   Glucose, Bld 114 (H) 70 - 99 mg/dL   BUN <5 (L) 6 - 20 mg/dL   Creatinine, Ser 1.74 0.44 - 1.00 mg/dL   Calcium 8.6 (L) 8.9 - 10.3 mg/dL   GFR calc non Af Amer >60 >60 mL/min   GFR calc Af Amer >60 >60 mL/min   Anion gap 6 5 - 15    Comment: Performed at [facility], [address] [place], [place] [address]  CBC     Status: Abnormal   Collection Time: 02/14/19  5:13 AM  Result Value Ref Range   WBC 7.6 4.0 - 10.5 K/uL   RBC 3.73 (L) 3.87 - 5.11 MIL/uL   Hemoglobin 11.1 (L) 12.0 - 15.0 g/dL   HCT 81.8 (L) 56.3 - 14.9 %   MCV 88.5 80.0 - 100.0 fL   MCH 29.8 26.0 - 34.0 pg   MCHC 33.6 30.0 - 36.0 g/dL   RDW 70.2 63.7 - 85.8 %   Platelets 314 150 - 400 K/uL   nRBC 0.0 0.0 - 0.2 %    Comment: Performed at [facility], [address], [place], [place] [address]  Magnesium     Status: None   Collection Time: 02/14/19  5:13 AM  Result Value Ref Range   Magnesium 1.9 1.7 - 2.4 mg/dL    Comment: Performed at [facility], [address], [place], [place] [address]  CBC     Status: Abnormal   Collection Time: 02/15/19  2:16 AM  Result Value Ref Range  WBC 9.4 4.0 - 10.5 K/uL   RBC 3.69 (L) 3.87 - 5.11 MIL/uL   Hemoglobin 10.8 (L) 12.0 - 15.0 g/dL   HCT 33.1 (L) 36.0 - 46.0 %   MCV 89.7 80.0 - 100.0 fL   MCH 29.3 26.0 - 34.0 pg   MCHC 32.6 30.0 - 36.0 g/dL   RDW 15.7 (H) 11.5 - 15.5 %   Platelets 313 150 - 400 K/uL   nRBC 0.0 0.0 - 0.2 %    Comment: Performed at [facility], [address] [place], [place] [address]  Basic metabolic panel     Status: Abnormal   Collection Time: 02/15/19  2:16 AM  Result Value Ref Range   Sodium 139 135 - 145 mmol/L   Potassium 4.5 3.5 - 5.1 mmol/L   Chloride 112 (H) 98 - 111 mmol/L   CO2 22 22 - 32 mmol/L   Glucose, Bld 105 (H) 70 - 99 mg/dL   BUN <5 (L) 6 - 20 mg/dL   Creatinine, Ser 0.49 0.44 - 1.00 mg/dL   Calcium 8.4 (L) 8.9 - 10.3 mg/dL   GFR calc non Af Amer >60 >60 mL/min   GFR calc Af Amer >60 >60 mL/min   Anion gap 5 5 - 15    Comment: Performed at [place] [address], [place], [place] [address]    Radiology/Results: DG Abd 2 Views  Result Date: 02/14/2019 CLINICAL DATA:  Abdominal pain. Appendectomy 01/24/2019. EXAM: ABDOMEN - 2 VIEW  COMPARISON:  Abdominal radiographs earlier this day. CT 02/12/2019 FINDINGS: Enteric tube is no longer seen. Persistent but decreased gaseous small bowel distension from exam earlier this day, few air-fluid levels persist. There is air in the colon. No evidence of free air. Enteric sutures noted in the right lower quadrant. Lung bases are clear. IMPRESSION: Persistent but decreased gaseous small bowel distension from exam earlier this day, consistent with improving small bowel obstruction. Electronically Signed   By: Keith Rake M.D.   On: 02/14/2019 02:09    Anti-infectives: Anti-infectives (From admission, onward)   None      Assessment/Plan: Problem List: Patient Active Problem List   Diagnosis Date Noted  . SBO (small bowel obstruction) ([place]) 02/12/2019  . Acute appendicitis with perforation and abscess s/p ileocectomy 01/24/2019 01/24/2019  . Palpitations 02/16/2018  . Cardiac murmur 02/16/2018    Advance to soft diet SL IVF's   LOS: 3 days   Rosario Adie, MD  Colorectal and [facility]   02/15/2019 10:29 AM

## 2019-02-16 ENCOUNTER — Inpatient Hospital Stay (HOSPITAL_COMMUNITY): Payer: BC Managed Care – PPO

## 2019-02-16 MED ORDER — IOHEXOL 300 MG/ML  SOLN
100.0000 mL | Freq: Once | INTRAMUSCULAR | Status: AC | PRN
  Administered 2019-02-16: 23:00:00 100 mL via INTRAVENOUS

## 2019-02-16 NOTE — Plan of Care (Addendum)
  Problem: Pain Managment: Goal: General experience of comfort will improve Outcome: Progressing   Problem: Elimination: Goal: Will not experience complications related to bowel motility Outcome: Progressing   Problem: Coping: Goal: Level of anxiety will decrease Outcome: Progressing  Pt tolerating soft diet at this time. Has a c/o of gas. Prn simethicone given per MD order. Pt had a small dk brown bowel movement. Noted LUQ and LLQ abdominal quadrants with active Bowel sounds. RUQ/RLQ abdominal quadrants with hypoactive bowel sounds. Encouraged pt to ambulate.

## 2019-02-16 NOTE — Progress Notes (Addendum)
Patient was told at 12:20pm to order lunch and then begin clear liquids at 2pm to prepare for a CT scan. Patient ate lunch and began clear liquids at 2pm and has not heard from anyone about her test until now at 6:30pm. She is not interested in having me check with CT at this time.  Addendum: During bedside report at 7:30pm, patient was finishing a dinner tray of soft food. Informed patient that CT was rescheduled for 10pm. She stated that she did not know that her CT would be done tonight because no one had told her. Called CT and they can still do CT tonight.   Peggye Ley, RN

## 2019-02-16 NOTE — Progress Notes (Addendum)
Patient ID: Ruth Hubbard, female   DOB: [birth year], 39 y.o.   MRN: [id number] [facility] Surgery Progress Note:   Subjective:   No pain complaints, tolerating a diet and having bowel function.  Concerned about her BP again this AM. Objective: Vital signs in last 24 hours: Temp:  [98.4 F (36.9 C)-98.9 F (37.2 C)] 98.4 F (36.9 C) (01/01 0916) Pulse Rate:  [62-70] 62 (01/01 0916) Resp:  [14-18] 18 (01/01 0916) BP: (98-114)/(42-59) 98/42 (01/01 0916) SpO2:  [97 %-100 %] 98 % (01/01 0916)  Intake/Output from previous day: 12/31 0701 - 01/01 0700 In: 240 [P.O.:240] Out: 2 [Urine:2] Intake/Output this shift: Total I/O In: 120 [P.O.:120] Out: -   Physical Exam: NAD   Abdomen is soft   Lab Results:  Results for orders placed or performed during the hospital encounter of 02/12/19 (from the past 48 hour(s))  CBC     Status: Abnormal   Collection Time: 02/15/19  2:16 AM  Result Value Ref Range   WBC 9.4 4.0 - 10.5 K/uL   RBC 3.69 (L) 3.87 - 5.11 MIL/uL   Hemoglobin 10.8 (L) 12.0 - 15.0 g/dL   HCT 67.8 (L) 93.8 - 10.1 %   MCV 89.7 80.0 - 100.0 fL   MCH 29.3 26.0 - 34.0 pg   MCHC 32.6 30.0 - 36.0 g/dL   RDW 75.1 (H) 02.5 - 85.2 %   Platelets 313 150 - 400 K/uL   nRBC 0.0 0.0 - 0.2 %    Comment: Performed at [facility], [address], [place], [place] [address]  Basic metabolic panel     Status: Abnormal   Collection Time: 02/15/19  2:16 AM  Result Value Ref Range   Sodium 139 135 - 145 mmol/L   Potassium 4.5 3.5 - 5.1 mmol/L   Chloride 112 (H) 98 - 111 mmol/L   CO2 22 22 - 32 mmol/L   Glucose, Bld 105 (H) 70 - 99 mg/dL   BUN <5 (L) 6 - 20 mg/dL   Creatinine, Ser 2.35 0.44 - 1.00 mg/dL   Calcium 8.4 (L) 8.9 - 10.3 mg/dL   GFR calc non Af Amer >60 >60 mL/min   GFR calc Af Amer >60 >60 mL/min   Anion gap 5 5 - 15    Comment: Performed at [facility], [address], [place], [place] [address]    Radiology/Results: No results  found.  Anti-infectives: Anti-infectives (From admission, onward)   None      Assessment/Plan: Problem List: Patient Active Problem List   Diagnosis Date Noted  . SBO (small bowel obstruction) ([facility]) 02/12/2019  . Acute appendicitis with perforation and abscess s/p ileocectomy 01/24/2019 01/24/2019  . Palpitations 02/16/2018  . Cardiac murmur 02/16/2018    Cont soft diet Will get f/u CT.  Pt concerned there is still a problem within her abd   LOS: 4 days   Vanita Panda, MD  Colorectal and General Surgery [facility]   02/16/2019 10:02 AM

## 2019-02-16 NOTE — Progress Notes (Addendum)
2042: CT sent up contrast for patient to drink.  Patient expressed her concerns about the contrast making her sick, and that the Zofran did not help.  Patient was explained the right to refuse contrast, Zofran, and CT. Patient stated that she did not want to refuse the Zofran, Contrast or CT. Zofran and Contrast administered.  Charge Nurse present at beside.   2130: Patient tolerating contrast drink, no Nausea and vomiting noted, however the patient states that her stomach is distended and hurts. She also states that she has started to have pain on the outside of her left arm that started to hurt when her stomach started to hurt. Pain does not radiate. When asked if the patient wanted pain medication she stated that she wasn't sure. Patient was informed on pain and anxiety medications available and requested ativan. Ativan administered.  2235: Patient transported to CT  0440: Patient requesting ativan. Ativan administered. Patient expresses concern of not passing gas or having a bowel movement since she drank the contrast. The patient was informed that she could have something ordered to help her have a BM however she declined.

## 2019-02-17 NOTE — Discharge Summary (Signed)
Physician Discharge Summary  Patient ID: Ruth Hubbard MRN: [id number] DOB/AGE: [birth year] 39 y.o.  Admit date: 02/12/2019 Discharge date: 02/17/2019  Admission Diagnoses: early post op SBO  Discharge Diagnoses:  Active Problems:   SBO (small bowel obstruction) ([facility])   Discharged Condition: good  Hospital Course: Pt admitted for early post op SBO.  An NG was placed.  Symptoms resolved within 48 hrs and NG was removed.  Diet was advanced.  Repeat CT was performed after she was tolerating a diet to make sure no other pathology was noted.  This was essentially normal except for some GB sludge.  She was felt to be in stable condition for discharge to home.  Consults: None  Significant Diagnostic Studies: labs: cbc, bmet  Treatments: IV hydration and NG decompression  Discharge Exam: Blood pressure (!) 107/52, pulse (!) 58, temperature 98.2 F (36.8 C), temperature source Oral, resp. rate 17, height 5\' 4"  (1.626 m), weight 63.7 kg, SpO2 95 %. General appearance: alert and cooperative GI: soft Incision/Wound:  Disposition: Discharge disposition: 01-Home or Self Care        Allergies as of 02/17/2019   No Known Allergies     Medication List    TAKE these medications   acetaminophen 325 MG tablet Commonly known as: TYLENOL Take 1-2 tablets (325-650 mg total) by mouth every 6 (six) hours as needed for mild pain, fever or headache.   ascorbic acid 250 MG Chew Commonly known as: VITAMIN C Chew 250 mg by mouth daily.   Hair Skin & Nails Gummies 1250-7.5-7.5 MCG-MG-UNT Chew Generic drug: Biotin w/ Vitamins C & E Chew 1 tablet by mouth daily.   Lumify 0.025 % Soln Generic drug: Brimonidine Tartrate Place 1 drop into both eyes daily.   VITAMIN B-12 ER PO Take 1 tablet by mouth daily.   VITAMIN E PO Take 1 capsule by mouth daily.      Follow-up Information    04/17/2019, MD. Call.   Specialty: General Surgery Why: on Mon to get date and time for new follow up  apt Contact information: [address] [place] [phone]           Signed: [phone] 02/17/2019, 10:11 AM

## 2019-02-17 NOTE — Progress Notes (Signed)
Pt was made aware of d/c orders and pt gathered their belongings and got dressed. Pt called ride and they are on their way to [facility]. IV was removed, all questions answered to pt satisfaction. Emry Barbato Mena Goes, RN 02/17/2019 11:16 AM

## 2019-02-17 NOTE — Discharge Instructions (Addendum)
Bowel Obstruction A bowel obstruction is a blockage in the small or large bowel. The bowel, which is also called the intestine, is a long, slender tube that connects the stomach to the anus. When a person eats and drinks, food and fluids go from the mouth to the stomach to the small bowel. This is where most of the nutrients in the food and fluids are absorbed. After the small bowel, material passes through the large bowel for further absorption until any leftover material leaves the body as stool through the anus during a bowel movement. A bowel obstruction will prevent food and fluids from passing through the bowel as they normally do during digestion. The bowel can become partially or completely blocked. If this condition is not treated, it can be dangerous because the bowel could rupture. What are the causes? Common causes of this condition include:  Scar tissue (adhesions) from previous surgery or treatment with high-energy X-rays (radiation).  Recent surgery. This may cause the movements of the bowel to slow down and cause food to block the intestine.  Inflammatory bowel disease, such as Crohn's disease or diverticulitis.  Growths or tumors.  A bulging organ (hernia).  Twisting of the bowel (volvulus).  A foreign body.  Slipping of a part of the bowel into another part (intussusception). What are the signs or symptoms? Symptoms of this condition include:  Pain in the abdomen. Depending on the degree of obstruction, pain may be: ? Mild or severe. ? Dull cramping or sharp pain. ? In one area or in the entire abdomen.  Nausea and vomiting. Vomit may be greenish or a yellow bile color.  Bloating in the abdomen.  Difficulty passing stool (constipation).  Lack of passing gas.  Frequent belching.  Diarrhea. This may occur if the obstruction is partial and runny stool is able to leak around the obstruction. How is this diagnosed? This condition may be diagnosed based on:  A  physical exam.  Medical history.  Imaging tests of the abdomen or pelvis, such as X-ray or CT scan.  Blood or urine tests. How is this treated? Treatment for this condition depends on the cause and severity of the problem. Treatment may include:  Fluids and pain medicines that are given through an IV. Your health care provider may instruct you not to eat or drink if you have nausea or vomiting.  Eating a simple diet. You may be asked to consume a clear liquid diet for several days. This allows the bowel to rest.  Placement of a small tube (nasogastric tube) into the stomach. This will relieve pain, discomfort, and nausea by removing blocked air and fluids from the stomach. It can also help the obstruction clear up faster.  Surgery. This may be required if other treatments do not work. Surgery may be required for: ? Bowel obstruction from a hernia. This can be an emergency procedure. ? Scar tissue that causes frequent or severe obstructions. Follow these instructions at home: Medicines  Take over-the-counter and prescription medicines only as told by your health care provider.  If you were prescribed an antibiotic medicine, take it as told by your health care provider. Do not stop taking the antibiotic even if you start to feel better. General instructions DIET: Follow a light bland diet the first 24 hours after arrival home, such as soup, liquids, crackers, etc.  Be sure to include lots of fluids daily.  Avoid fast food or heavy meals as your are more likely to get nauseated.  Do not eat any uncooked fruits or vegetables for the next 2 weeks   Avoid sitting for a long time without moving. Get up to take short walks every 1-2 hours. This is important to improve blood flow and breathing. Ask for help if you feel weak or unsteady.  Keep all follow-up visits as told by your health care provider. This is important. How is this prevented? After having a bowel obstruction, you are more  likely to have another. You may do the following things to prevent another obstruction:  If you have a long-term (chronic) disease, pay attention to your symptoms and contact your health care provider if you have questions or concerns.  Avoid becoming constipated. To prevent or treat constipation, your health care provider may recommend that you: ? Drink enough fluid to keep your urine pale yellow. ? Take over-the-counter or prescription medicines. ? Eat foods that are high in fiber, such as beans, whole grains, and fresh fruits and vegetables. ? Limit foods that are high in fat and processed sugars, such as fried or sweet foods.  Stay active. Exercise for 30 minutes or more, 5 or more days each week. Ask your health care provider which exercises are safe for you.  Avoid stress. Find ways to reduce stress, such as meditation, exercise, or taking time for activities that relax you.  Instead of eating three large meals each day, try eat three small meals with three small snacks.  Do not use any products that contain nicotine or tobacco, such as cigarettes and e-cigarettes. If you need help quitting, ask your health care provider. Contact a health care provider if you:  Have a fever.  Have chills. Get help right away if you:  Have increased pain or cramping.  Vomit blood.  Have uncontrolled vomiting or nausea.  Cannot drink fluids because of vomiting or pain.  Become confused.  Begin feeling very thirsty (dehydrated).  Have severe bloating.  Feel extremely weak or you faint. Summary  A bowel obstruction is a blockage in the small or large bowel.  A bowel obstruction will prevent food and fluids from passing through the bowel as they normally do during digestion.  Treatment for this condition depends on the cause and severity of the problem. It may include fluids and pain medicines through an IV, a simple diet, a nasogastric tube, or surgery.  Follow instructions from your  health care provider about eating restrictions. You may need to avoid solid foods and consume only clear liquids until your condition improves. This information is not intended to replace advice given to you by your health care provider. Make sure you discuss any questions you have with your health care provider. Document Revised: 03/10/2018 Document Reviewed: 06/15/2017 Elsevier Patient Education  2020 [employer]   WHEN TO CALL us [phone]: 1. Poor pain control 2. Reactions / problems with new medications (rash/itching, nausea, etc)  3. Fever over 101.5 F (38.5 C) 4. Inability to urinate 5. Nausea and/or vomiting 6. Worsening swelling or bruising 7. Continued bleeding from incision. 8. Increased pain, redness, or drainage from the incision  The clinic staff is available to answer your questions during regular business hours (8:30am-5pm).  Please don't hesitate to call and ask to speak to one of our nurses for clinical concerns.   A surgeon from [facility] is always on call at the hospitals   If you have a medical emergency, go to the nearest emergency room or call 911.  [facility] [address], [place], [place]  [address] ? MAIN: [phone] ? TOLL FREE: [phone] ? FAX [phone] [email or web address]

## 2019-02-17 NOTE — Progress Notes (Signed)
AVS reviewed with pt, all questions answered. Pt ride arrived and pt was brought down to the car with all their belongings.  Aydyn Testerman Mena Goes, RN 02/17/2019 12:00 PM

## 2019-02-17 NOTE — Plan of Care (Signed)
  Problem: Education: Goal: Knowledge of General Education information will improve Description: Including pain rating scale, medication(s)/side effects and non-pharmacologic comfort measures Outcome: Progressing   Problem: Clinical Measurements: Goal: Ability to maintain clinical measurements within normal limits will improve Outcome: Progressing   Problem: Activity: Goal: Risk for activity intolerance will decrease Outcome: Progressing   Problem: Nutrition: Goal: Adequate nutrition will be maintained Outcome: Progressing   Problem: Coping: Goal: Level of anxiety will decrease Outcome: Progressing   Problem: Elimination: Goal: Will not experience complications related to bowel motility Outcome: Progressing Goal: Will not experience complications related to urinary retention Outcome: Progressing   Problem: Pain Managment: Goal: General experience of comfort will improve Outcome: Progressing   Problem: Safety: Goal: Ability to remain free from injury will improve Outcome: Progressing   Problem: Skin Integrity: Goal: Risk for impaired skin integrity will decrease Outcome: Progressing   

## 2019-03-06 ENCOUNTER — Other Ambulatory Visit: Payer: Self-pay | Admitting: General Surgery

## 2019-03-06 ENCOUNTER — Ambulatory Visit (HOSPITAL_COMMUNITY)
Admission: RE | Admit: 2019-03-06 | Discharge: 2019-03-06 | Disposition: A | Discharge status: Home / Self Care | Payer: BC Managed Care – PPO | Source: Ambulatory Visit | Attending: General Surgery | Admitting: General Surgery

## 2019-03-06 ENCOUNTER — Other Ambulatory Visit: Payer: Self-pay

## 2019-03-06 ENCOUNTER — Other Ambulatory Visit (HOSPITAL_COMMUNITY): Payer: Self-pay | Admitting: General Surgery

## 2019-03-06 DIAGNOSIS — K3532 Acute appendicitis with perforation and localized peritonitis, without abscess: Secondary | ICD-10-CM

## 2019-03-06 MED ORDER — IOHEXOL 300 MG/ML  SOLN
100.0000 mL | Freq: Once | INTRAMUSCULAR | Status: AC | PRN
  Administered 2019-03-06: 100 mL via INTRAVENOUS

## 2021-02-05 IMAGING — CT CT ABD-PELV W/ CM
2 of 4 series · 16 of 46 positions shown, 18 images · IV contrast (APPLIED)
Comparison: 02/16/2019

CLINICAL DATA: Appendicitis with perforation, follow-up

EXAM:
CT ABDOMEN AND PELVIS WITH CONTRAST
TECHNIQUE: Multidetector CT imaging of the abdomen and pelvis was performed
using the standard protocol following bolus administration of
intravenous contrast. Sagittal and coronal MPR images reconstructed
from axial data set.
CONTRAST:  100mL OMNIPAQUE IOHEXOL 300 MG/ML SOLN IV. Dilute oral
contrast.

[Series 3: abdomen 5.0 · axial · 0.63mm/px · z∈[-417,-37]mm · 13 of 84 slices shown, 15 images]
[im 4/84  soft-tissue]
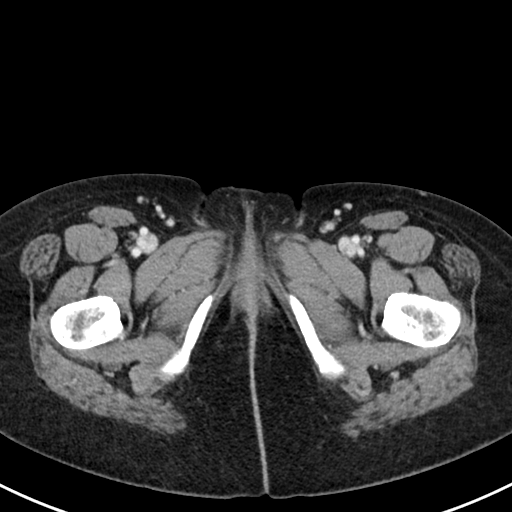
[im 4/84  bone]
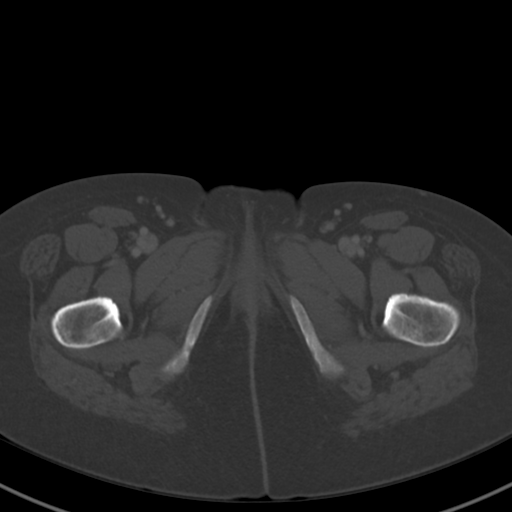
[im 12/84  soft-tissue]
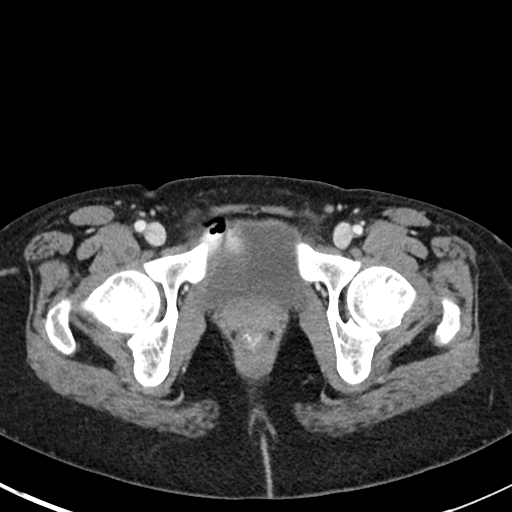
[im 16/84  soft-tissue]
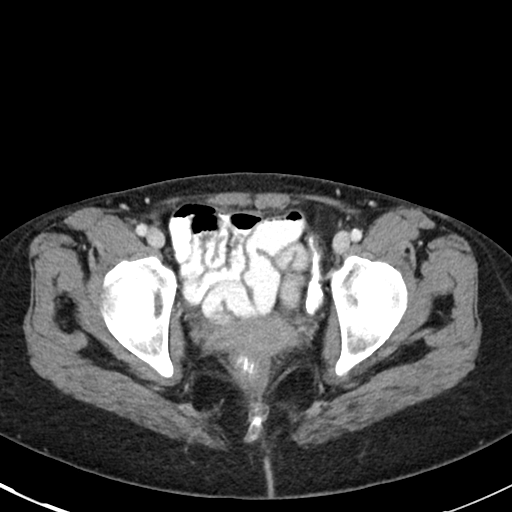
[im 24/84  soft-tissue]
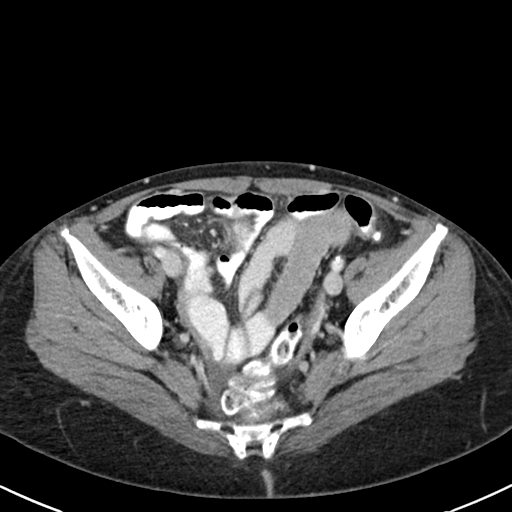
[im 28/84  soft-tissue]
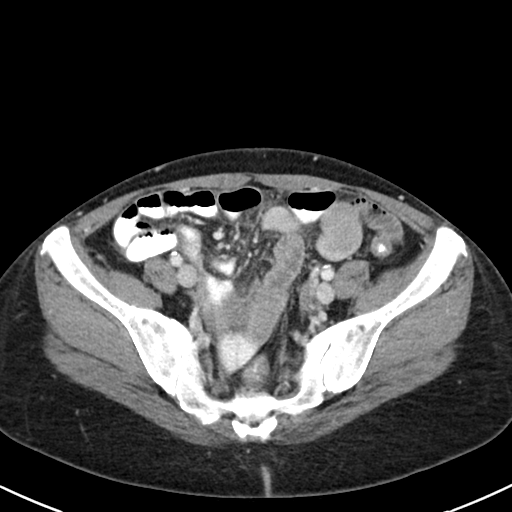
[im 36/84  soft-tissue]
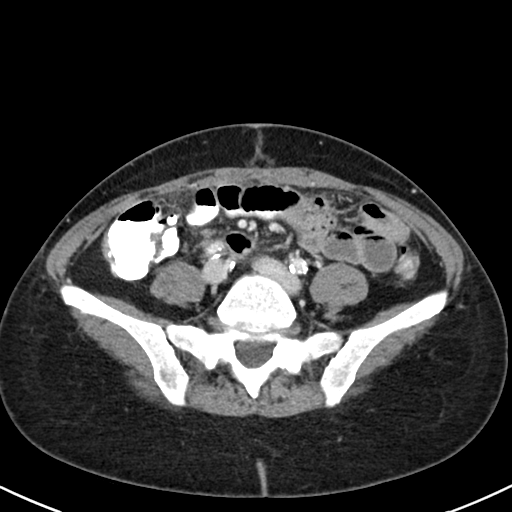
[im 44/84  soft-tissue]
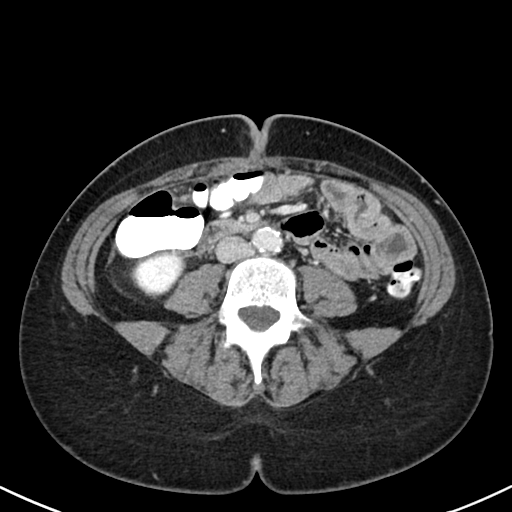
[im 48/84  soft-tissue]
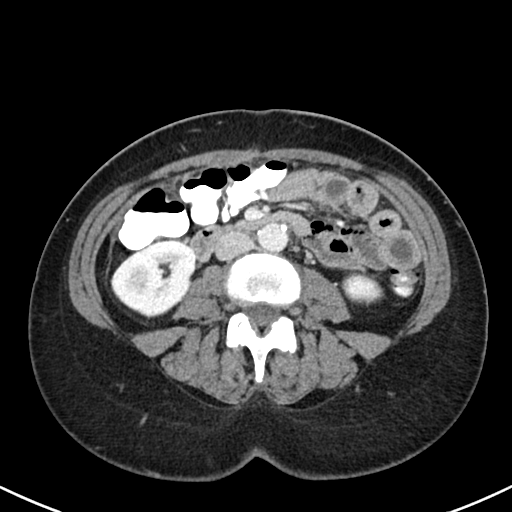
[im 56/84  soft-tissue]
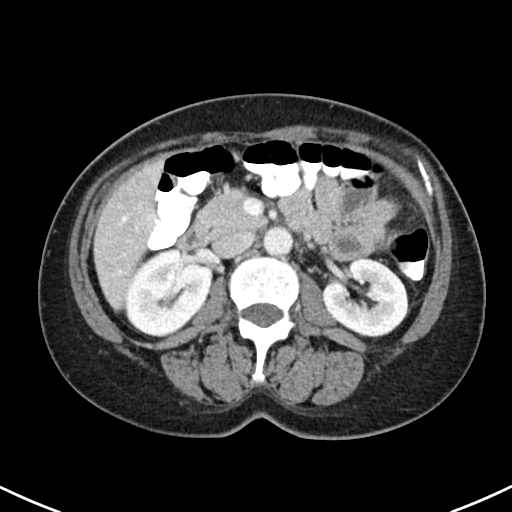
[im 56/84  bone]
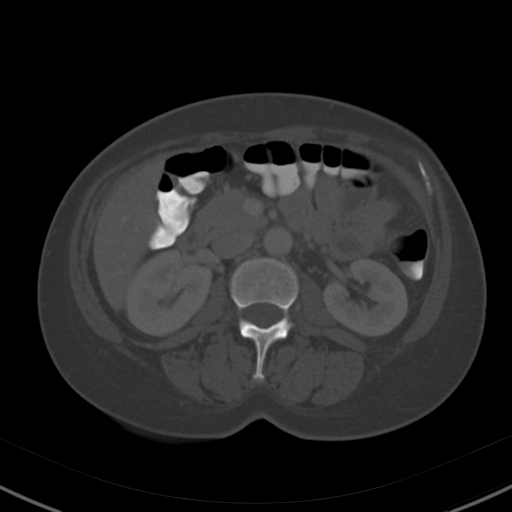
[im 60/84  soft-tissue]
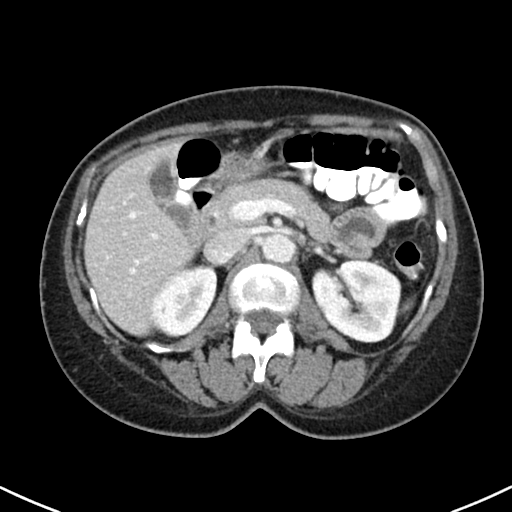
[im 68/84  soft-tissue]
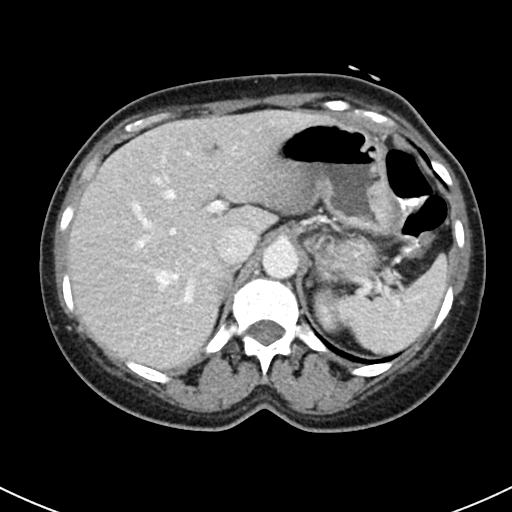
[im 72/84  soft-tissue]
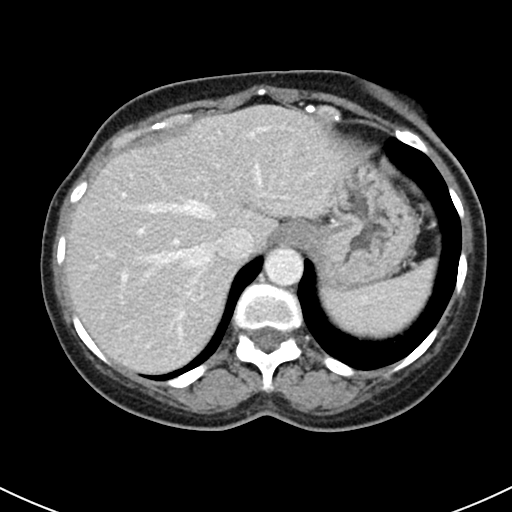
[im 80/84  soft-tissue]
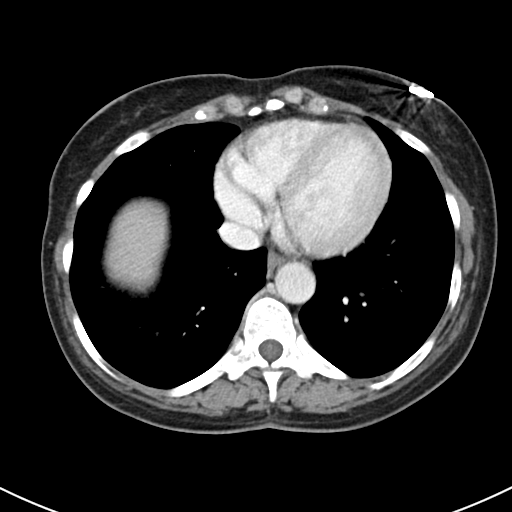

[Series 6: abdomen 3.0 mpr cor · coronal · 0.63mm/px · 3 of 93 slices shown]
[im 31/93  soft-tissue]
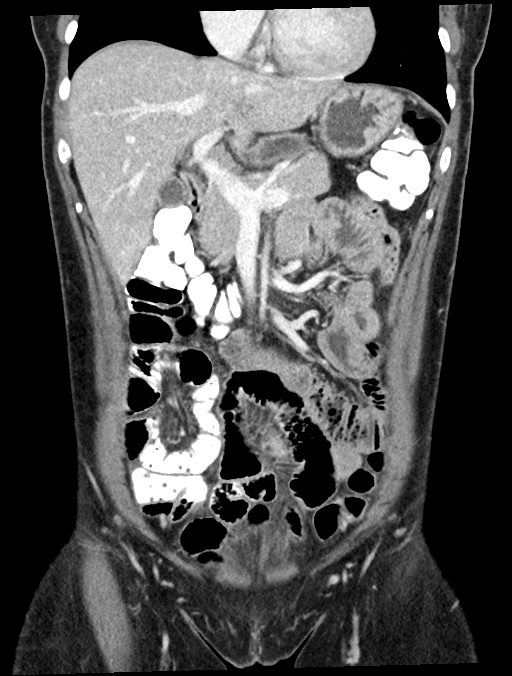
[im 41/93  soft-tissue]
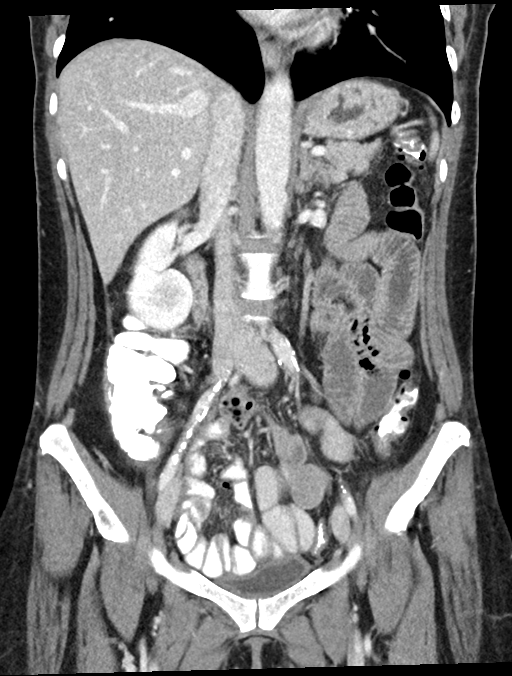
[im 52/93  soft-tissue]
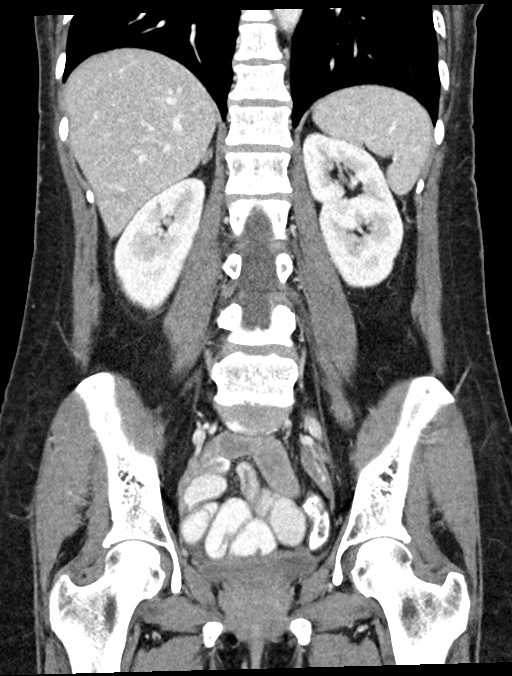

[16 of 46 positions shown; findings below may reference images not displayed]

FINDINGS: Lower chest: Lung bases clear

Hepatobiliary: Gallstone within gallbladder. Gallbladder contracted.
Liver normal appearance.

Pancreas: Normal appearance

Spleen: Normal appearance

Adrenals/Urinary Tract: Adrenal glands, kidneys, ureters, and
bladder normal appearance

Stomach/Bowel: Stomach normal appearance. Few unopacified small
bowel loops in pelvis. No small bowel wall thickening or dilatation.
Small bowel thickening seen on previous exam resolved. Oral contrast
present throughout colon to rectum. Postsurgical changes of
appendectomy seen with decreased infiltrative changes in the RIGHT
pelvis versus prior exam.

Vascular/Lymphatic: Atherosclerotic calcifications aorta and iliac
arteries premature for age. No adenopathy.

Reproductive: Unremarkable uterus and adnexa

Other: Small amount of nonspecific low-attenuation free pelvic
fluid. No free air. No hernia.

Musculoskeletal: Unremarkable.
IMPRESSION: Significantly improved inflammatory changes in the pelvis post
appendicitis and appendectomy.

Small amount of residual nonspecific low-attenuation free pelvic
fluid.

Cholelithiasis.

Premature atherosclerotic calcifications for age within aorta and
iliac arteries.

Aortic Atherosclerosis ([id number].D).
